# Patient Record
Sex: Female | Born: 1965 | Race: White | Hispanic: No | Marital: Married | Smoking: Current every day smoker
Health system: Southern US, Community
[De-identification: ages and names within clinical notes are randomized; demographics above are authoritative.]

## PROBLEM LIST (undated history)

## (undated) DIAGNOSIS — J449 Chronic obstructive pulmonary disease, unspecified: Secondary | ICD-10-CM

## (undated) DIAGNOSIS — E785 Hyperlipidemia, unspecified: Secondary | ICD-10-CM

## (undated) DIAGNOSIS — F32A Depression, unspecified: Secondary | ICD-10-CM

## (undated) DIAGNOSIS — F419 Anxiety disorder, unspecified: Secondary | ICD-10-CM

## (undated) DIAGNOSIS — M1990 Unspecified osteoarthritis, unspecified site: Secondary | ICD-10-CM

## (undated) DIAGNOSIS — G4730 Sleep apnea, unspecified: Secondary | ICD-10-CM

## (undated) DIAGNOSIS — T7840XA Allergy, unspecified, initial encounter: Secondary | ICD-10-CM

## (undated) HISTORY — DX: Sleep apnea, unspecified: G47.30

## (undated) HISTORY — DX: Hyperlipidemia, unspecified: E78.5

## (undated) HISTORY — PX: OTHER SURGICAL HISTORY: SHX169

## (undated) HISTORY — DX: Allergy, unspecified, initial encounter: T78.40XA

## (undated) HISTORY — DX: Anxiety disorder, unspecified: F41.9

## (undated) HISTORY — DX: Depression, unspecified: F32.A

## (undated) HISTORY — PX: HYSTERECTOMY ABDOMINAL WITH SALPINGECTOMY: SHX6725

---

## 2012-07-18 ENCOUNTER — Other Ambulatory Visit: Payer: Self-pay

## 2012-07-18 ENCOUNTER — Emergency Department (HOSPITAL_COMMUNITY)
Admission: EM | Admit: 2012-07-18 | Discharge: 2012-07-18 | Disposition: A | Discharge status: Home / Self Care | Payer: Self-pay | Attending: Emergency Medicine | Admitting: Emergency Medicine

## 2012-07-18 ENCOUNTER — Emergency Department (HOSPITAL_COMMUNITY): Payer: Self-pay

## 2012-07-18 ENCOUNTER — Encounter (HOSPITAL_COMMUNITY): Payer: Self-pay | Admitting: *Deleted

## 2012-07-18 DIAGNOSIS — R0789 Other chest pain: Secondary | ICD-10-CM | POA: Insufficient documentation

## 2012-07-18 DIAGNOSIS — F17200 Nicotine dependence, unspecified, uncomplicated: Secondary | ICD-10-CM | POA: Insufficient documentation

## 2012-07-18 DIAGNOSIS — R0602 Shortness of breath: Secondary | ICD-10-CM | POA: Insufficient documentation

## 2012-07-18 DIAGNOSIS — J449 Chronic obstructive pulmonary disease, unspecified: Secondary | ICD-10-CM | POA: Insufficient documentation

## 2012-07-18 DIAGNOSIS — R109 Unspecified abdominal pain: Secondary | ICD-10-CM | POA: Insufficient documentation

## 2012-07-18 DIAGNOSIS — J439 Emphysema, unspecified: Secondary | ICD-10-CM

## 2012-07-18 DIAGNOSIS — J4489 Other specified chronic obstructive pulmonary disease: Secondary | ICD-10-CM | POA: Insufficient documentation

## 2012-07-18 HISTORY — DX: Chronic obstructive pulmonary disease, unspecified: J44.9

## 2012-07-18 LAB — COMPREHENSIVE METABOLIC PANEL
ALT: 16 U/L (ref 0–35)
AST: 18 U/L (ref 0–37)
Albumin: 3.8 g/dL (ref 3.5–5.2)
Alkaline Phosphatase: 88 U/L (ref 39–117)
Calcium: 9.9 mg/dL (ref 8.4–10.5)
GFR calc Af Amer: >90 mL/min (ref >90)
Glucose, Bld: 85 mg/dL (ref 70–99)
Potassium: 4.3 mEq/L (ref 3.5–5.1)
Sodium: 140 mEq/L (ref 135–145)
Total Protein: 7.6 g/dL (ref 6.0–8.3)

## 2012-07-18 LAB — CBC WITH DIFFERENTIAL/PLATELET
Basophils Absolute: 0 10*3/uL (ref 0.0–0.1)
Basophils Relative: 0 % (ref 0–1)
Eosinophils Absolute: 0.1 10*3/uL (ref 0.0–0.7)
Eosinophils Relative: 1 % (ref 0–5)
Lymphs Abs: 2.8 10*3/uL (ref 0.7–4.0)
MCH: 32.1 pg (ref 26.0–34.0)
MCHC: 35.8 g/dL (ref 30.0–36.0)
MCV: 89.6 fL (ref 78.0–100.0)
Neutrophils Relative %: 60 % (ref 43–77)
Platelets: 237 10*3/uL (ref 150–400)
RBC: 4.61 MIL/uL (ref 3.87–5.11)
RDW: 12.9 % (ref 11.5–15.5)

## 2012-07-18 LAB — URINALYSIS, ROUTINE W REFLEX MICROSCOPIC
Glucose, UA: NEGATIVE mg/dL
Hgb urine dipstick: NEGATIVE
Leukocytes, UA: NEGATIVE
Protein, ur: NEGATIVE mg/dL
Specific Gravity, Urine: 1.009 (ref 1.005–1.030)

## 2012-07-18 LAB — PRO B NATRIURETIC PEPTIDE: Pro B Natriuretic peptide (BNP): 75.8 pg/mL (ref 0–125)

## 2012-07-18 LAB — POCT I-STAT TROPONIN I: Troponin i, poc: 0 ng/mL (ref 0.00–0.08)

## 2012-07-18 MED ORDER — ALBUTEROL SULFATE (5 MG/ML) 0.5% IN NEBU
5.0000 mg | INHALATION_SOLUTION | Freq: Once | RESPIRATORY_TRACT | Status: AC
  Administered 2012-07-18: 5 mg via RESPIRATORY_TRACT
  Filled 2012-07-18: qty 1

## 2012-07-18 MED ORDER — IOHEXOL 350 MG/ML SOLN
100.0000 mL | Freq: Once | INTRAVENOUS | Status: AC | PRN
  Administered 2012-07-18: 100 mL via INTRAVENOUS

## 2012-07-18 MED ORDER — IPRATROPIUM BROMIDE 0.02 % IN SOLN
0.5000 mg | Freq: Once | RESPIRATORY_TRACT | Status: AC
  Administered 2012-07-18: 0.5 mg via RESPIRATORY_TRACT
  Filled 2012-07-18: qty 2.5

## 2012-07-18 MED ORDER — METHYLPREDNISOLONE SODIUM SUCC 125 MG IJ SOLR
125.0000 mg | Freq: Once | INTRAMUSCULAR | Status: AC
  Administered 2012-07-18: 125 mg via INTRAVENOUS
  Filled 2012-07-18: qty 2

## 2012-07-18 MED ORDER — PREDNISONE 10 MG PO TABS: ORAL_TABLET | ORAL | Status: DC

## 2012-07-18 MED ORDER — HYDROCODONE-ACETAMINOPHEN 5-500 MG PO TABS: 1.0000 | ORAL_TABLET | Freq: Four times a day (QID) | ORAL | Status: DC | PRN

## 2012-07-18 MED ORDER — LORAZEPAM 1 MG PO TABS
1.0000 mg | ORAL_TABLET | Freq: Once | ORAL | Status: AC
  Administered 2012-07-18: 1 mg via ORAL
  Filled 2012-07-18: qty 1

## 2012-07-18 NOTE — ED Notes (Signed)
The pt is c/o sob and bi-lateral lower chest pain sudden onset Wednesday.  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  Rt thigh pain for 2-3 days then stopped and that is when her chest pain started.  lmp none hys

## 2012-07-18 NOTE — ED Provider Notes (Signed)
This chart was scribed for Glynn Octave, MD by Bennett Scrape, ED Scribe. This patient was seen in room A05C/A05C and the patient's care was started at 8:42 PM.  Holly Macdonald is a 46 y.o. female with a h/o COPD (diagnosed 10 years ago) who presents to the Emergency Department complaining of 2 days of gradual onset, gradually worsening, constant SOB that is worse with laying down with associated mid back pain that radiates under both anterior ribs. Pt states that the symptoms started after she woke up 2 days ago. She denies any prior admissions for COPD exacerbation. She reports a mild HA attributed to sinus infections but denies cough, fevers, and leg swelling as associated symptoms. She is a current everyday smoker but denies alcohol use.  No PCP, moved here 2 years ago  RESPIRATORY: Tachypnea, lungs are clear to ausculation, no wheezing or rhonchi noted MUSCULOSKELETAL: No lower extremity edema  8:47 PM- Discussed treatment plan which includes a CT angio with pt at bedside and pt agreed to plan. Advised pt to quit smoking and consueled her on her smoking cessation options.  I personally performed the services described in this documentation, which was scribed in my presence. The recorded information has been reviewed and is accurate.  Medical screening examination/treatment/procedure(s) were conducted as a shared visit with non-physician practitioner(s) and myself.  I personally evaluated the patient during the encounter   Glynn Octave, MD 07/18/12 [phone]

## 2012-07-18 NOTE — ED Provider Notes (Signed)
History     CSN: [id number]  Arrival date & time 07/18/12  1534   First MD Initiated Contact with Patient 07/18/12 1837      Chief Complaint  Patient presents with  . Shortness of Breath    (Consider location/radiation/quality/duration/timing/severity/associated sxs/prior treatment) HPI Comments: Holly Macdonald is a 46 y.o. female who presents with complaint of shortness of breath and bilateral lower rib pain. Pt states pain started 2 days ago when she woke up. States pain is over bilateral lower chest radiating into back. States pain worsened with deep breathing and exertion. States no prior hx of the same. Hx of COPD, not on any medications. States recent cold, however denies cough. Denies fever, chills. States no recent surgeries or travel. Admits to pain in right thigh last week, however that has resolved. No LE swelling.    Past Medical History  Diagnosis Date  . COPD (chronic obstructive pulmonary disease)     History reviewed. No pertinent past surgical history.  No family history on file.  History  Substance Use Topics  . Smoking status: Current Every Day Smoker  . Smokeless tobacco: Not on file  . Alcohol Use: No    OB History    Grav Para Term Preterm Abortions TAB SAB Ect Mult Living                  Review of Systems  Constitutional: Negative for chills and fatigue.  HENT: Negative for neck pain and neck stiffness.   Respiratory: Positive for chest tightness and shortness of breath. Negative for cough and choking.   Cardiovascular: Positive for chest pain. Negative for palpitations and leg swelling.  Gastrointestinal: Negative.   Genitourinary: Positive for flank pain.  Musculoskeletal: Negative.   Skin: Negative.   Neurological: Negative for dizziness, weakness and light-headedness.  Psychiatric/Behavioral: Negative.     Allergies  Review of patient's allergies indicates not on file.  Home Medications  No current outpatient prescriptions on  file.  BP 122/81  Pulse 78  Temp 97.8 F (36.6 C) (Oral)  Resp 15  SpO2 100%  Physical Exam  Nursing note and vitals reviewed. Constitutional: She is oriented to person, place, and time. She appears well-developed and well-nourished. No distress.  Eyes: Conjunctivae normal are normal.  Neck: Neck supple.  Cardiovascular: Normal rate, regular rhythm and normal heart sounds.   Pulmonary/Chest:       Tachypenc, breath sounds diminished bilaterally  Abdominal: Soft. Bowel sounds are normal. She exhibits no distension. There is no tenderness. There is no rebound.  Musculoskeletal: She exhibits no edema.       No calf tenderness bilaterally  Neurological: She is alert and oriented to person, place, and time.  Skin: Skin is warm and dry.  Psychiatric: She has a normal mood and affect. Her behavior is normal.    ED Course  Procedures (including critical care time)  Pt with SOB, bilateral lower chest pain for 2-3 days. Will get labs, CXR, monitor.    Date: 07/18/2012  Rate: 77  Rhythm: normal sinus rhythm and sinus arrhythmia  QRS Axis: normal  Intervals: normal  ST/T Wave abnormalities: normal  Conduction Disutrbances:nonspecific intraventricular conduction delay  Narrative Interpretation:   Old EKG Reviewed: none available  Results for orders placed during the hospital encounter of 07/18/12  CBC WITH DIFFERENTIAL      Component Value Range   WBC 8.8  4.0 - 10.5 K/uL   RBC 4.61  3.87 - 5.11 MIL/uL   Hemoglobin  14.8  12.0 - 15.0 g/dL   HCT 16.1  09.6 - 04.5 %   MCV 89.6  78.0 - 100.0 fL   MCH 32.1  26.0 - 34.0 pg   MCHC 35.8  30.0 - 36.0 g/dL   RDW 40.9  81.1 - 91.4 %   Platelets 237  150 - 400 K/uL   Neutrophils Relative 60  43 - 77 %   Neutro Abs 5.2  1.7 - 7.7 K/uL   Lymphocytes Relative 32  12 - 46 %   Lymphs Abs 2.8  0.7 - 4.0 K/uL   Monocytes Relative 7  3 - 12 %   Monocytes Absolute 0.6  0.1 - 1.0 K/uL   Eosinophils Relative 1  0 - 5 %   Eosinophils Absolute  0.1  0.0 - 0.7 K/uL   Basophils Relative 0  0 - 1 %   Basophils Absolute 0.0  0.0 - 0.1 K/uL  COMPREHENSIVE METABOLIC PANEL      Component Value Range   Sodium 140  135 - 145 mEq/L   Potassium 4.3  3.5 - 5.1 mEq/L   Chloride 103  96 - 112 mEq/L   CO2 24  19 - 32 mEq/L   Glucose, Bld 85  70 - 99 mg/dL   BUN 7  6 - 23 mg/dL   Creatinine, Ser 7.82  0.50 - 1.10 mg/dL   Calcium 9.9  8.4 - 95.6 mg/dL   Total Protein 7.6  6.0 - 8.3 g/dL   Albumin 3.8  3.5 - 5.2 g/dL   AST 18  0 - 37 U/L   ALT 16  0 - 35 U/L   Alkaline Phosphatase 88  39 - 117 U/L   Total Bilirubin 0.2 (*) 0.3 - 1.2 mg/dL   GFR calc non Af Amer >90  >90 mL/min   GFR calc Af Amer >90  >90 mL/min  TROPONIN I      Component Value Range   Troponin I <0.30  <0.30 ng/mL  D-DIMER, QUANTITATIVE      Component Value Range   D-Dimer, Quant <0.27  0.00 - 0.48 ug/mL-FEU  URINALYSIS, ROUTINE W REFLEX MICROSCOPIC      Component Value Range   Color, Urine YELLOW  YELLOW   APPearance CLOUDY (*) CLEAR   Specific Gravity, Urine 1.009  1.005 - 1.030   pH 6.5  5.0 - 8.0   Glucose, UA NEGATIVE  NEGATIVE mg/dL   Hgb urine dipstick NEGATIVE  NEGATIVE   Bilirubin Urine NEGATIVE  NEGATIVE   Ketones, ur NEGATIVE  NEGATIVE mg/dL   Protein, ur NEGATIVE  NEGATIVE mg/dL   Urobilinogen, UA 0.2  0.0 - 1.0 mg/dL   Nitrite NEGATIVE  NEGATIVE   Leukocytes, UA NEGATIVE  NEGATIVE  PRO B NATRIURETIC PEPTIDE      Component Value Range   Pro B Natriuretic peptide (BNP) 75.8  0 - 125 pg/mL   Dg Chest 2 View  07/18/2012  *RADIOLOGY REPORT*  Clinical Data: Shortness of breath.  CHEST - 2 VIEW  Comparison:  None.  Findings:  The heart size and mediastinal contours are within normal limits.  Both lungs are clear.  The visualized skeletal structures are unremarkable.  IMPRESSION: No active cardiopulmonary disease.   Original Report Authenticated By: Myles Rosenthal, M.D.    Ct Angio Chest W/cm &/or Wo Cm  07/18/2012  *RADIOLOGY REPORT*  Clinical Data:  Shortness of breath  CT ANGIOGRAPHY CHEST  Technique:  Multidetector CT imaging of the chest using  the standard protocol during bolus administration of intravenous contrast. Multiplanar reconstructed images including MIPs were obtained and reviewed to evaluate the vascular anatomy.  Contrast: OMNIPAQUE IOHEXOL 350 MG/ML SOLN  Comparison: 07/18/2012 radiograph  Findings: Several images are degraded by respiratory motion. Allowing for this, no pulmonary arterial branch filling defect is identified.  Normal caliber aorta with scattered atherosclerosis. Normal heart size.  No pleural or pericardial effusion.  No intrathoracic lymphadenopathy.  Limited images through the upper abdomen show no acute finding.  Central airways are patent. There are centrolobular emphysematous changes. No confluent airspace opacity. 3 mm nodule right upper lobe on series 5 image 37.  There are a couple additional subpleural nodules of a similar size.  No pneumothorax.  No acute osseous finding.  IMPRESSION: Several images degraded by respiratory motion.  Allowing for this, no pulmonary embolism identified.  Centrolobular emphysematous changes.  There are a couple subpleural nodules, measuring up to 3 mm, nonspecific.  12 month chest CT follow-up recommended.   Original Report Authenticated By: Jearld Lesch, M.D.       1. Shortness of breath   2. Emphysema       MDM  Pt with SOB, tachypnea, pain over bilateral lower lungs. Denies fever, chills, malaise, denies productive cough. She is a smoker. Labs, CT angio, CXR negative. Lungs are clear. Pt improved with 2 nebs. Two sets of troponins negative. Pt is speaking with full sentences. Abdomen non tender. Discussed with Dr. Manus Gunning who saw pt as well. Will start on steroids, albuterol, pain meds, follow up as needed. Instructed to return if worsening.         Lottie Mussel, [place] 07/19/12 [phone]

## 2012-07-18 NOTE — ED Notes (Signed)
Pt dc to home.   Pt ambulatory to exit without difficulty.  Pt verbalizes understanding to dc instructions.  Pt will f/u with provider.

## 2012-07-19 NOTE — ED Provider Notes (Signed)
Medical screening examination/treatment/procedure(s) were conducted as a shared visit with non-physician practitioner(s) and myself.  I personally evaluated the patient during the encounter See my additional note  Glynn Octave, MD 07/19/12 1215

## 2014-05-03 ENCOUNTER — Other Ambulatory Visit (HOSPITAL_COMMUNITY): Payer: Self-pay | Admitting: Physician Assistant

## 2014-05-03 DIAGNOSIS — Z1231 Encounter for screening mammogram for malignant neoplasm of breast: Secondary | ICD-10-CM

## 2014-05-05 ENCOUNTER — Ambulatory Visit (HOSPITAL_COMMUNITY)
Admission: RE | Admit: 2014-05-05 | Discharge: 2014-05-05 | Disposition: A | Discharge status: Home / Self Care | Payer: Self-pay | Source: Ambulatory Visit | Attending: Physician Assistant | Admitting: Physician Assistant

## 2014-05-05 DIAGNOSIS — Z1231 Encounter for screening mammogram for malignant neoplasm of breast: Secondary | ICD-10-CM

## 2014-07-21 ENCOUNTER — Emergency Department (HOSPITAL_COMMUNITY)
Admission: EM | Admit: 2014-07-21 | Discharge: 2014-07-21 | Disposition: A | Discharge status: Home / Self Care | Payer: Self-pay | Attending: Emergency Medicine | Admitting: Emergency Medicine

## 2014-07-21 ENCOUNTER — Encounter (HOSPITAL_COMMUNITY): Payer: Self-pay | Admitting: Cardiology

## 2014-07-21 DIAGNOSIS — Z7952 Long term (current) use of systemic steroids: Secondary | ICD-10-CM | POA: Insufficient documentation

## 2014-07-21 DIAGNOSIS — J449 Chronic obstructive pulmonary disease, unspecified: Secondary | ICD-10-CM | POA: Insufficient documentation

## 2014-07-21 DIAGNOSIS — Z880 Allergy status to penicillin: Secondary | ICD-10-CM | POA: Insufficient documentation

## 2014-07-21 DIAGNOSIS — M1990 Unspecified osteoarthritis, unspecified site: Secondary | ICD-10-CM | POA: Insufficient documentation

## 2014-07-21 DIAGNOSIS — M62830 Muscle spasm of back: Secondary | ICD-10-CM | POA: Insufficient documentation

## 2014-07-21 DIAGNOSIS — Z720 Tobacco use: Secondary | ICD-10-CM | POA: Insufficient documentation

## 2014-07-21 HISTORY — DX: Unspecified osteoarthritis, unspecified site: M19.90

## 2014-07-21 MED ORDER — HYDROCODONE-ACETAMINOPHEN 5-325 MG PO TABS: 2.0000 | ORAL_TABLET | ORAL | Status: DC | PRN

## 2014-07-21 MED ORDER — METHOCARBAMOL 500 MG PO TABS: 500.0000 mg | ORAL_TABLET | Freq: Two times a day (BID) | ORAL | Status: DC | PRN

## 2014-07-21 MED ORDER — NAPROXEN 500 MG PO TABS: 500.0000 mg | ORAL_TABLET | Freq: Two times a day (BID) | ORAL | Status: DC

## 2014-07-21 MED ORDER — KETOROLAC TROMETHAMINE 60 MG/2ML IM SOLN
60.0000 mg | Freq: Once | INTRAMUSCULAR | Status: AC
  Administered 2014-07-21: 60 mg via INTRAMUSCULAR
  Filled 2014-07-21: qty 2

## 2014-07-21 MED ORDER — METHOCARBAMOL 500 MG PO TABS
500.0000 mg | ORAL_TABLET | Freq: Once | ORAL | Status: AC
  Administered 2014-07-21: 500 mg via ORAL
  Filled 2014-07-21: qty 1

## 2014-07-21 NOTE — ED Notes (Signed)
Lower back spasms Monday.  Pain is worse today.  C/o pain and numbness to right leg and foot.  States she has had a cyst on sacral area 7-8 years ago and she feels like the cyst has been coming back the past 3 months.

## 2014-07-21 NOTE — Discharge Instructions (Signed)
Back Pain: ° ° °Your back pain should be treated with medicines such as ibuprofen or aleve and this back pain should get better over the next 2 weeks.  However if you develop severe or worsening pain, low back pain with fever, numbness, weakness or inability to walk or urinate, you should return to the ER immediately.  Please follow up with your doctor this week for a recheck if still having symptoms. °Low back pain is discomfort in the lower back that may be due to injuries to muscles and ligaments around the spine.  Occasionally, it may be caused by a a problem to a part of the spine called a disc.  The pain may last several days or a week;  However, most patients get completely well in 4 weeks. ° °Self - care:  The application of heat can help soothe the pain.  Maintaining your daily activities, including walking, is encourged, as it will help you get better faster than just staying in bed. ° °Medications are also useful to help with pain control.  A commonly prescribed medications includes acetaminophen.  This medication is generally safe, though you should not take more than 8 of the extra strength (500mg) pills a day. ° °Non steroidal anti inflammatory medications including Ibuprofen and naproxen;  These medications help both pain and swelling and are very useful in treating back pain.  They should be taken with food, as they can cause stomach upset, and more seriously, stomach bleeding.   ° °Muscle relaxants:  These medications can help with muscle tightness that is a cause of lower back pain.  Most of these medications can cause drowsiness, and it is not safe to drive or use dangerous machinery while taking them. ° °You will need to follow up with  Your primary healthcare provider in 1-2 weeks for reassessment. ° °Be aware that if you develop new symptoms, such as a fever, leg weakness, difficulty with or loss of control of your urine or bowels, abdominal pain, or more severe pain, you will need to seek  medical attention and  / or return to the Emergency department. ° °If you do not have a doctor see the list below. ° °RESOURCE GUIDE ° °Chronic Pain Problems: °Contact [facility]  [phone] °Patients need to be referred by their primary care doctor. ° °Insufficient Money for Medicine: °Contact [employer] Way:  call "211" or [facility] 271-5999. ° °No Primary Care Doctor: °- Call [facility]  832-8000 - can help you locate a primary care doctor that  accepts your insurance, provides certain services, etc. °- Physician Referral Service- 1-800-533-3463 ° °Agencies that provide inexpensive medical care: °- [facility]  [phone] °- [facility]  [phone] °- [facility]  [phone] °- [facility]  832-4777 °- [facility]  [phone] °- [facility]  [phone] ° °Medicaid-accepting [place] Providers: °- [facility]- [address] ° [phone], Mon-Fri 9am-7pm, Sat 9am-1pm °- [facility]- [address], [address] ° [phone] °- [facility]- [address], [address] ° [phone] °- [facility]- [address] [place] [address] ° [phone] °- [facility]- [address], [address], [phone] ° Only accepts [place] Access Medicaid patients after they have their name  applied to their card ° °Self Pay (no insurance) in [place]: °- Sickle Cell Patients: Dr Eric Dean, [facility] °   [address], [phone] °- [facility]- [address] ° [phone] °      -      [facility]- [address] °      -     [facility]- see information above (Speak to Pam H if you do not have insurance) °      -  [facility]- [address], [phone] °      -  [facility] [place]- [address],  [phone] °      -  [facility]- [address] [place] [address],  [phone] °      -  Dr Osei-Bonsu-  [address], [place], [phone] °      -  [facility]- [address], [phone] °      -  [facility]- [address] [place] [address], [phone], also [address], [phone] °      -    [facility]- [address], [phone], 1st & 3rd Saturday   every month, 10am-1pm ° °1) Find a Doctor and Pay Out of Pocket °Although you won't have to find out who is covered by your insurance plan, it is a good idea to ask around and get recommendations. You will then need to call the office and see if the doctor you have chosen will accept you as a new patient and what types of options they offer for patients who are self-pay. Some doctors offer discounts or will set up payment plans for their patients who do not have insurance, but you will need to ask so you aren't surprised when you get to your appointment. ° °2) Contact Your Local Health Department °Not all health departments have doctors that can see patients for sick visits, but many do, so it is worth a call to see if yours does. If you don't know where your local health department is, you can check in your phone book. The CDC also has a tool to help you locate your state's health department, and many state websites also have listings of all of their local health departments. ° °3) Find a Walk-in Clinic °If your illness is not likely to be very severe or complicated, you may want to try a walk in clinic. These are popping up all over the country in pharmacies, drugstores, and shopping centers. They're usually staffed by nurse practitioners or physician assistants that have been trained to treat common illnesses and complaints. They're usually fairly quick and inexpensive. However, if you have serious medical issues or chronic medical problems, these are probably not your best option ° °STD Testing °- [place] [facility] [place], [facility], [address], [place], phone [phone] or 1-877-539-9860.  Monday - Friday, call for an appointment. °- [place] [facility] [place], [facility], [address], [place], phone [phone] or 1-877-539-9860.  Monday - Friday, call for an appointment. ° °Abuse/Neglect: °- [place] Child Abuse Hotline (336) 641-3795 °- [place] Child Abuse Hotline 800-378-5315 (After Hours) ° °Emergency Shelter:  [facility] (336) 271-5985 ° °Maternity Homes: °- [facility] (336) 275-9566 °- [facility] (704) 372-4663 ° °MRSA Hotline #:   832-7006 ° °[place] Resources ° °[facility] [place]  [employer] [place] [facility] °[address]                 [address]         [address]  °[place]                                               [place]                              [place] °Phone:  349-3220                                  Phone:  [phone]                   Phone:  342-8140 ° °[facility], 342-8316 °- [place] [facility] - [facility]- 1-888-581-9988 °      -     [facility] in [place], [address],                                  336-349-4454, Insurance ° °[place] Child Abuse Hotline °(336) 342-1394 or (336) 342-3537 (After Hours) ° ° °Behavioral Health Services ° °Substance Abuse Resources: °- [facility]  336-882-2125 °- [facility] 336-784-9470 °- [facility] 336-285-9073 °- [facility] 336-845-3988 °- Residential & Outpatient Substance Abuse Program  800-659-3381 ° °Psychological Services: °- [facility]  832-9600 °- [facility]  378-7881 °- [facility], [address], [place], ACCESS LINE: 1-800-853-5163 or 336-641-4981, Http://www.[facility]/services/adult.htm ° °Dental Assistance ° °If unable to pay or  uninsured, contact:  [facility] or [facility] to become qualified for the adult dental clinic. ° °Patients with Medicaid: [facility] °[address], [phone] °[address], [phone] ° °If unable to pay, or uninsured, contact [facility] (271-5999) or [place] [facility] (641-3152 in [place], 842-7733 in [place]) to become qualified for the adult dental clinic ° °Other Low-Cost Community Dental Services: °- [facility]- [address], [place], [place], [address], 723-1848, Ext. [address], 2nd and 4th Thursday of the month at 6:30am.  10 clients each day by appointment, can sometimes see walk-in patients if someone does not show for an appointment. °- [facility]- [address], [place], [place], [address], [phone] °- [facility]- [address], [place], [place], [address], [phone] °- [facility]- 342-8273 °- [facility]- [phone] °- [facility]- [phone] ° ° ° ° ° ° °

## 2014-07-21 NOTE — ED Provider Notes (Signed)
CSN: [id number]     Arrival date & time 07/21/14  1048 History   This chart was scribed for [name], MD by Rayfield Citizen, ED Scribe. This patient was seen in room APA10/APA10 and the patient's care was started at 10:55 AM.    Chief Complaint  Patient presents with  . Back Pain   The history is provided by the patient. No language interpreter was used.     HPI Comments: Holly Macdonald is a 48 y.o. female who presents to the Emergency Department complaining of 3 days of worsening lower back pain and spasms, rated 8/10 at present. She also reports radiating pain to her groin, right leg and right foot. Her pain is worse with movement (ambulation, sitting up). She has difficulty ambulating due to pain. No red flags for pathological back pain. She denies numbness, weakness, tingling in her arms; she denies difficulty urinating, personal history of cancer, IV drugs or IV meds, recent tattoos, abdominal pain, chest pain, abnormal headaches.   Patient reports that she had a cyst on her sacral area 7-8 years ago and for the past three months, she feels as though the cyst may be returning.   She denies other significant medical history.   Past Medical History  Diagnosis Date  . COPD (chronic obstructive pulmonary disease)   . Arthritis    History reviewed. No pertinent past surgical history. History reviewed. No pertinent family history. History  Substance Use Topics  . Smoking status: Current Every Day Smoker  . Smokeless tobacco: Not on file  . Alcohol Use: No   OB History    No data available     Review of Systems  Cardiovascular: Negative for chest pain.  Genitourinary: Negative for difficulty urinating.  Musculoskeletal: Positive for back pain.  Neurological: Negative for weakness and headaches. Numbness: decreased sensation in right upper thigh.  All other systems reviewed and are negative.   Allergies  Morphine and related; Codeine; and Penicillins  Home Medications    Prior to Admission medications   Medication Sig Start Date End Date Taking? Authorizing Provider  acetaminophen (TYLENOL) 500 MG tablet Take 1,500 mg by mouth every 6 (six) hours as needed. For pain    Historical Provider, MD  HYDROcodone-acetaminophen (VICODIN) 5-500 MG per tablet Take 1 tablet by mouth every 6 (six) hours as needed for pain. 07/18/12   Tatyana A Kirichenko, PA-C  predniSONE (DELTASONE) 10 MG tablet Take 5 tab day 1, take 4 tab day 2, take 3 tab day 3, take 2 tab day 4, and take 1 tab day 5 07/18/12   Tatyana A Kirichenko, PA-C   Temp(Src) 97.9 F (36.6 C) (Oral)  Ht 5\' 2"  (1.575 m)  Wt 116 lb (52.617 kg)  BMI 21.21 kg/m2 Physical Exam  Constitutional: She is oriented to person, place, and time. She appears well-developed and well-nourished.  HENT:  Head: Normocephalic and atraumatic.  Neck: No tracheal deviation present.  Cardiovascular: Normal rate.   Pulmonary/Chest: Effort normal.  Musculoskeletal:  Right lower back TTP; no midline tenderness of CTLS spine  No joint swelling of the right lower extremity; no pain with passive rotation of the hip  Pain with flexion of the hip (low back) Able to SLR on the right, causes pain Regardless of position, change causes pain which goes away after a minute or two  Neurological: She is alert and oriented to person, place, and time.  Mild decreased sensation to the right upper thigh   Skin: Skin  is warm and dry.  Psychiatric: She has a normal mood and affect. Her behavior is normal.  Nursing note and vitals reviewed.   ED Course  Procedures   DIAGNOSTIC STUDIES:   COORDINATION OF CARE: 11:22 AM Discussed treatment plan with pt at bedside and pt agreed to plan.   Labs Review Labs Reviewed - No data to display  Imaging Review No results found.    MDM   Final diagnoses:  None   Pt has no red flags for pathological back pain - she ambulated with minimal difficulty but had obvious antalgic gatin -  improvement seen with meds - pt amenable to d/c.  Meds given in ED:  Medications  ketorolac (TORADOL) injection 60 mg (60 mg Intramuscular Given 07/21/14 1135)  methocarbamol (ROBAXIN) tablet 500 mg (500 mg Oral Given 07/21/14 1134)    Discharge Medication List as of 07/21/2014 11:24 AM    START taking these medications   Details  HYDROcodone-acetaminophen (NORCO/VICODIN) 5-325 MG per tablet Take 2 tablets by mouth every 4 (four) hours as needed., Starting 07/21/2014, Until Discontinued, Print    methocarbamol (ROBAXIN) 500 MG tablet Take 1 tablet (500 mg total) by mouth 2 (two) times daily as needed for muscle spasms., Starting 07/21/2014, Until Discontinued, Print    naproxen (NAPROSYN) 500 MG tablet Take 1 tablet (500 mg total) by mouth 2 (two) times daily with a meal., Starting 07/21/2014, Until Discontinued, Print         I personally performed the services described in this documentation, which was scribed in my presence. The recorded information has been reviewed and is accurate.        [name], MD 07/22/14 [phone]

## 2014-10-04 IMAGING — CT CT ANGIO CHEST
2 of 6 series · 19 of 46 positions shown · IV contrast (omnipaque)
Comparison: 07/18/2012 radiograph

CLINICAL DATA: Shortness of breath

CT ANGIOGRAPHY CHEST
TECHNIQUE: Multidetector CT imaging of the chest using the
standard protocol during bolus administration of intravenous
contrast. Multiplanar reconstructed images including MIPs were
obtained and reviewed to evaluate the vascular anatomy.
Contrast: 100mL OMNIPAQUE IOHEXOL 350 MG/ML SOLN

[Series 6: pulm embolism 1.0 b25f thin · axial · 0.57mm/px · z∈[-332,-66]mm · 16 of 293 slices shown]
[im 13/293  lung]
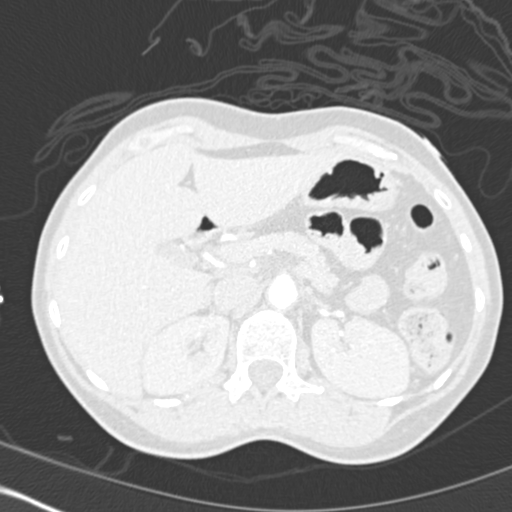
[im 39/293  soft-tissue]
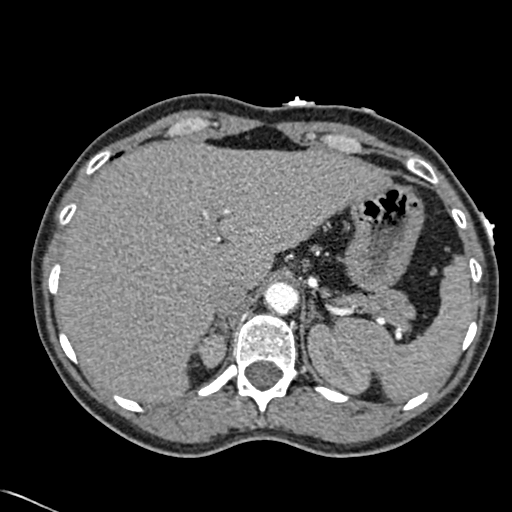
[im 51/293  lung]
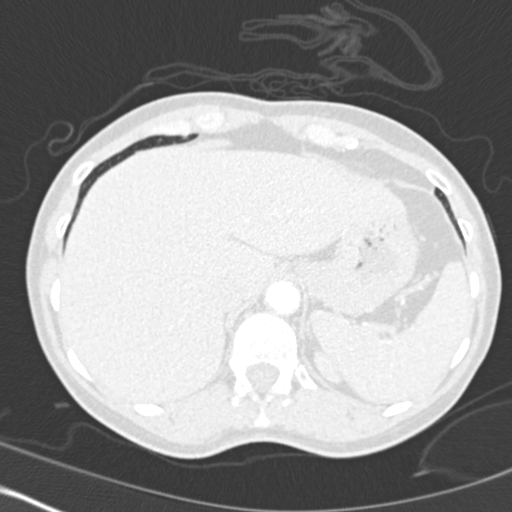
[im 64/293  soft-tissue]
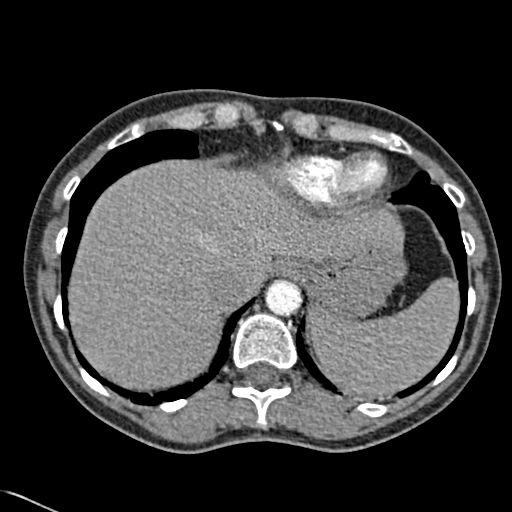
[im 89/293  lung]
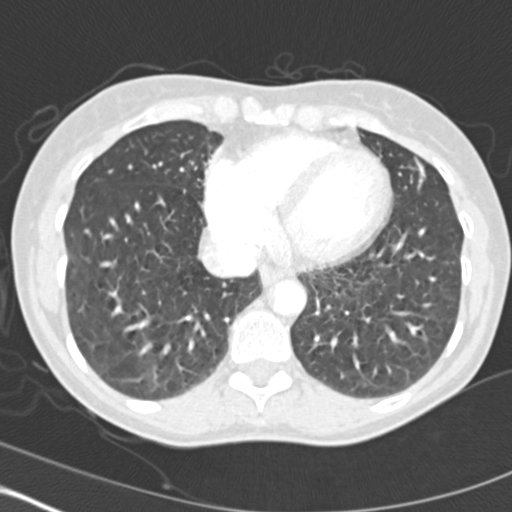
[im 102/293  soft-tissue]
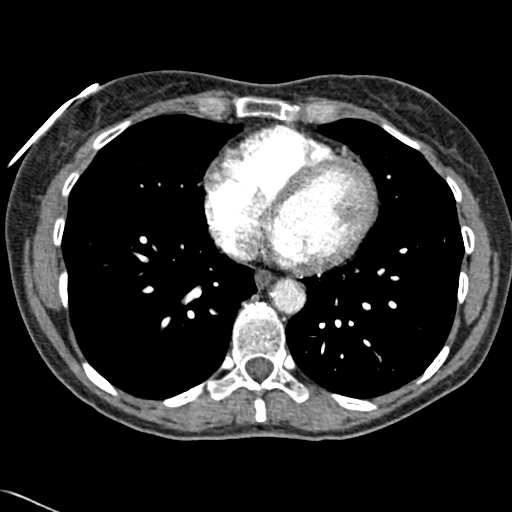
[im 115/293  lung]
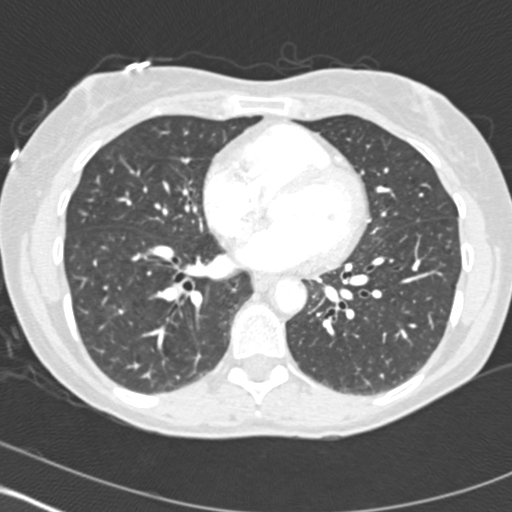
[im 140/293  soft-tissue]
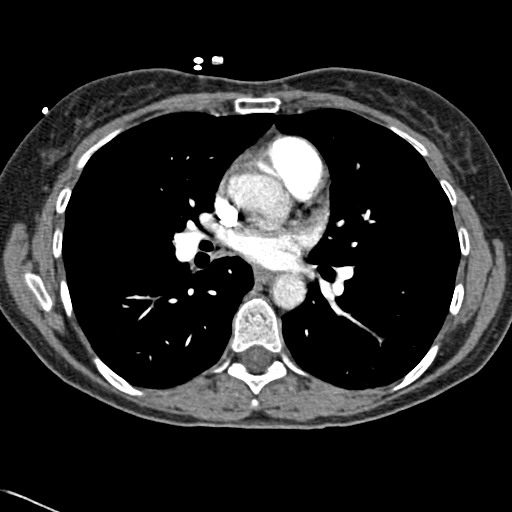
[im 153/293  lung]
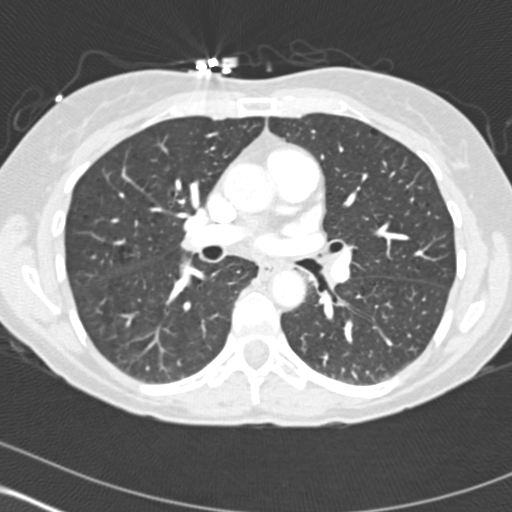
[im 178/293  soft-tissue]
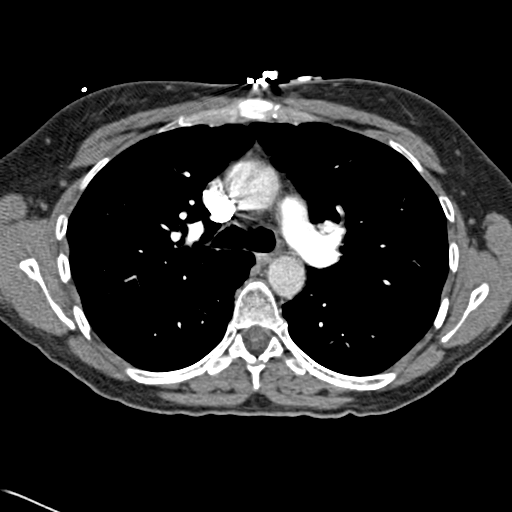
[im 191/293  lung]
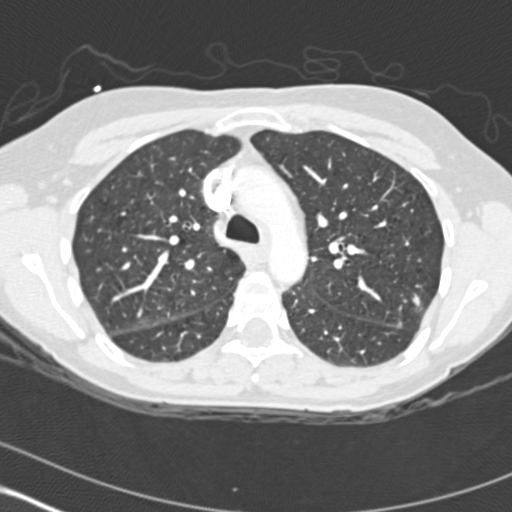
[im 204/293  soft-tissue]
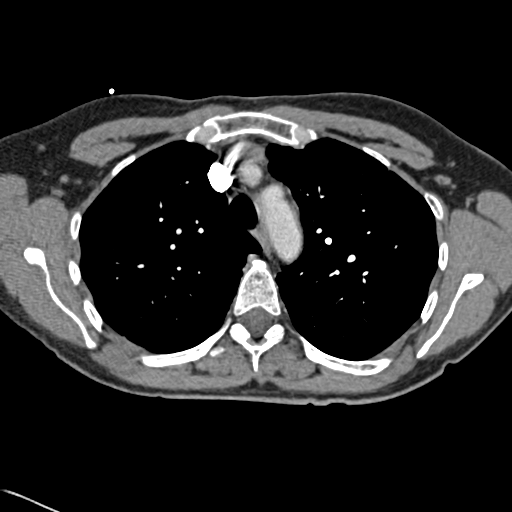
[im 229/293  lung]
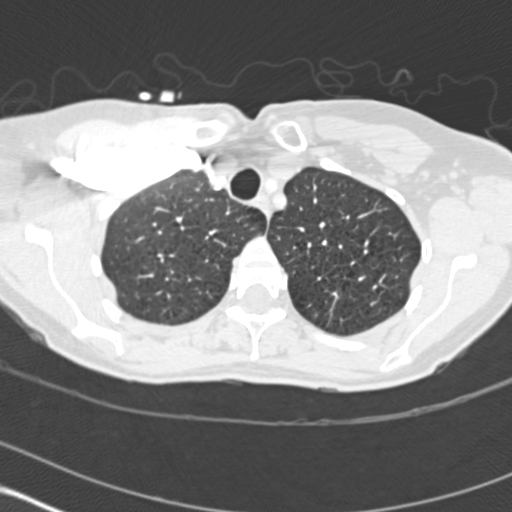
[im 242/293  soft-tissue]
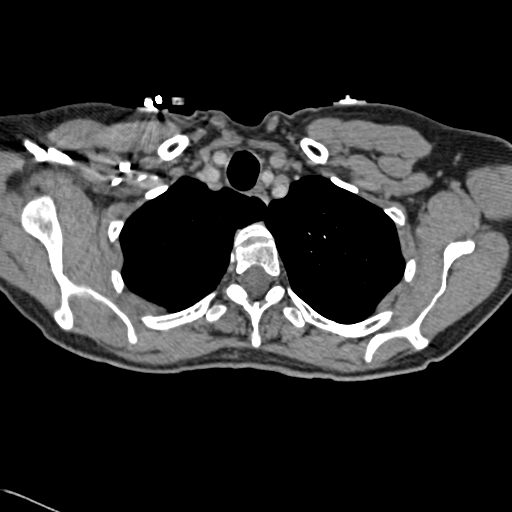
[im 254/293  lung]
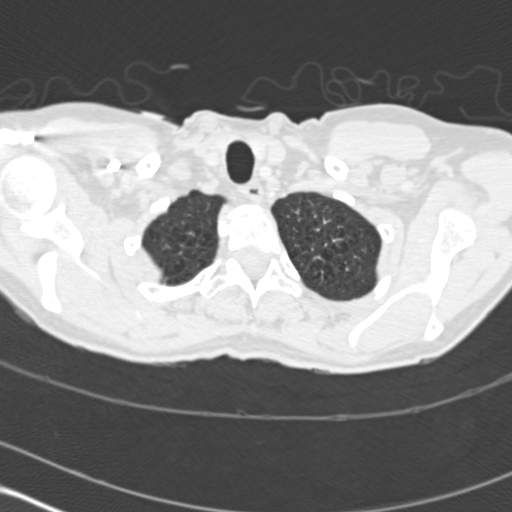
[im 280/293  soft-tissue]
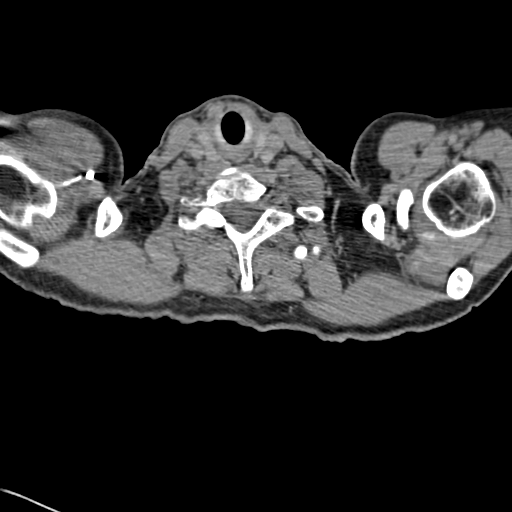

[Series 602: cor · coronal · 0.57mm/px · 3 of 92 slices shown]
[im 23/92  soft-tissue]
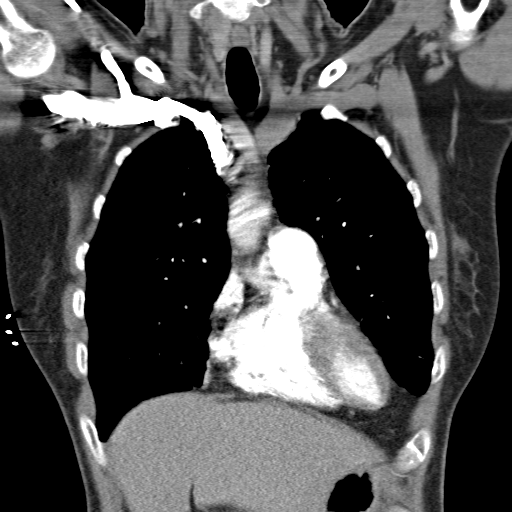
[im 46/92  soft-tissue]
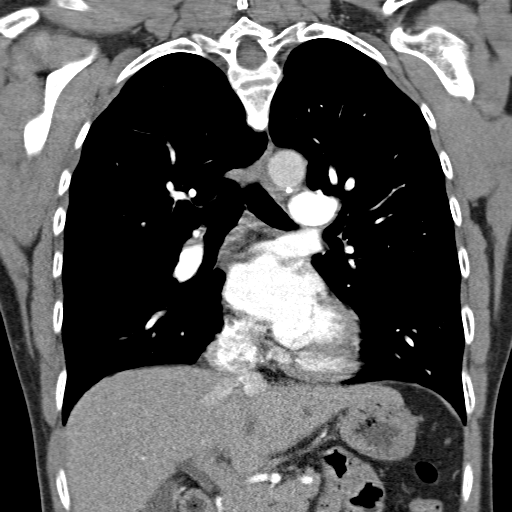
[im 69/92  soft-tissue]
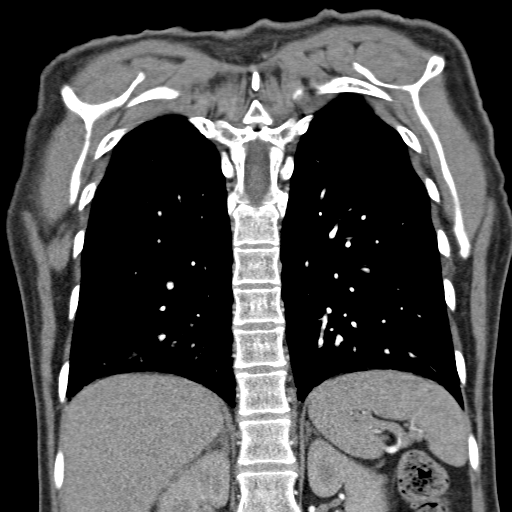

[19 of 46 positions shown; findings below may reference images not displayed]

FINDINGS: Several images are degraded by respiratory motion.
Allowing for this, no pulmonary arterial branch filling defect is
identified.  Normal caliber aorta with scattered atherosclerosis.
Normal heart size.  No pleural or pericardial effusion.  No
intrathoracic lymphadenopathy.

Limited images through the upper abdomen show no acute finding.

Central airways are patent. There are centrolobular emphysematous
changes. No confluent airspace opacity. 3 mm nodule right upper
lobe on series 5 image 37.  There are a couple additional
subpleural nodules of a similar size.  No pneumothorax.

No acute osseous finding.
IMPRESSION: Several images degraded by respiratory motion.  Allowing for this,
no pulmonary embolism identified.

Centrolobular emphysematous changes.

There are a couple subpleural nodules, measuring up to 3 mm,
nonspecific.  12 month chest CT follow-up recommended.

## 2015-09-21 ENCOUNTER — Ambulatory Visit (INDEPENDENT_AMBULATORY_CARE_PROVIDER_SITE_OTHER): Payer: BLUE CROSS/BLUE SHIELD | Admitting: Emergency Medicine

## 2015-09-21 VITALS — BP 130/78 | HR 75 | Temp 98.2°F | Resp 20 | Ht 63.0 in | Wt 123.4 lb

## 2015-09-21 DIAGNOSIS — J0140 Acute pansinusitis, unspecified: Secondary | ICD-10-CM

## 2015-09-21 DIAGNOSIS — F17200 Nicotine dependence, unspecified, uncomplicated: Secondary | ICD-10-CM

## 2015-09-21 MED ORDER — LEVOFLOXACIN 500 MG PO TABS: 500.0000 mg | ORAL_TABLET | Freq: Every day | ORAL | Status: AC

## 2015-09-21 MED ORDER — PSEUDOEPHEDRINE-GUAIFENESIN ER 60-600 MG PO TB12: 1.0000 | ORAL_TABLET | Freq: Two times a day (BID) | ORAL | Status: AC

## 2015-09-21 NOTE — Progress Notes (Signed)
Subjective:  Patient ID: Holly Macdonald, female    DOB: 04-Jun-1966  Age: 50 y.o. MRN: [id number]  CC: Sinus Problem and Sore Throat   HPI [name] presents    patient has a nasal congestion postnasal drainage purulent nasal discharge. He has pressure in around the cheeks and forehead. 's drainage from  Her eye a she has no cough fever chills wheezing or shortness of breath he has no nausea or vomiting. Stool change or rash.  She has strong history of smoking but is not interested in quitting.Marland Kitchen  History Shaniece has a past medical history of COPD (chronic obstructive pulmonary disease) ([place]) and Arthritis.   She has no past surgical history on file.   Her  family history is not on file.  She   reports that she has been smoking.  She does not have any smokeless tobacco history on file. She reports that she does not drink alcohol. Her drug history is not on file.  Outpatient Prescriptions Prior to Visit  Medication Sig Dispense Refill  . acetaminophen (TYLENOL) 650 MG CR tablet Take 650 mg by mouth 2 (two) times daily as needed for pain.    . calcium-vitamin D (OSCAL WITH D) 500-200 MG-UNIT per tablet Take 1 tablet by mouth daily with breakfast.    . HYDROcodone-acetaminophen (NORCO/VICODIN) 5-325 MG per tablet Take 2 tablets by mouth every 4 (four) hours as needed. (Patient not taking: Reported on 09/21/2015) 20 tablet 0  . lovastatin (MEVACOR) 10 MG tablet Take 10 mg by mouth at bedtime. Reported on 09/21/2015    . methocarbamol (ROBAXIN) 500 MG tablet Take 1 tablet (500 mg total) by mouth 2 (two) times daily as needed for muscle spasms. (Patient not taking: Reported on 09/21/2015) 20 tablet 0  . naproxen (NAPROSYN) 500 MG tablet Take 1 tablet (500 mg total) by mouth 2 (two) times daily with a meal. (Patient not taking: Reported on 09/21/2015) 30 tablet 0  . predniSONE (DELTASONE) 10 MG tablet Take 5 tab day 1, take 4 tab day 2, take 3 tab day 3, take 2 tab day 4, and take 1 tab day 5  (Patient not taking: Reported on 07/21/2014) 15 tablet 0   No facility-administered medications prior to visit.    Social History   Social History  . Marital Status: Single    Spouse Name: N/A  . Number of Children: N/A  . Years of Education: N/A   Social History Main Topics  . Smoking status: Current Every Day Smoker  . Smokeless tobacco: None  . Alcohol Use: No  . Drug Use: None  . Sexual Activity: Not Asked   Other Topics Concern  . None   Social History Narrative     Review of Systems  Constitutional: Negative for fever, chills and appetite change.  HENT: Positive for congestion, postnasal drip, rhinorrhea and sinus pressure. Negative for ear pain and sore throat.   Eyes: Positive for discharge. Negative for pain and redness.  Respiratory: Negative for cough, shortness of breath and wheezing.   Cardiovascular: Negative for leg swelling.  Gastrointestinal: Negative for nausea, vomiting, abdominal pain, diarrhea, constipation and blood in stool.  Endocrine: Negative for polyuria.  Genitourinary: Negative for dysuria, urgency, frequency and flank pain.  Musculoskeletal: Negative for gait problem.  Skin: Negative for rash.  Neurological: Negative for weakness and headaches.  Psychiatric/Behavioral: Negative for confusion and decreased concentration. The patient is not nervous/anxious.     Objective:  BP 130/78 mmHg  Pulse  75  Temp(Src) 98.2 F (36.8 C) (Oral)  Resp 20  Ht 5\' 3"  (1.6 m)  Wt 123 lb 6.4 oz (55.974 kg)  BMI 21.86 kg/m2  SpO2 98%  Physical Exam  Constitutional: She is oriented to person, place, and time. She appears well-developed and well-nourished. No distress.  HENT:  Head: Normocephalic and atraumatic.  Right Ear: External ear normal.  Left Ear: External ear normal.  Nose: Nose normal.  Eyes: Conjunctivae and EOM are normal. Pupils are equal, round, and reactive to light. No scleral icterus.  Neck: Normal range of motion. Neck supple. No  tracheal deviation present.  Cardiovascular: Normal rate, regular rhythm and normal heart sounds.   Pulmonary/Chest: Effort normal. No respiratory distress. She has no wheezes. She has no rales.  Abdominal: She exhibits no mass. There is no tenderness. There is no rebound and no guarding.  Musculoskeletal: She exhibits no edema.  Lymphadenopathy:    She has no cervical adenopathy.  Neurological: She is alert and oriented to person, place, and time. Coordination normal.  Skin: Skin is warm and dry. No rash noted.  Psychiatric: She has a normal mood and affect. Her behavior is normal.      Assessment & Plan:   Chidimma was seen today for sinus problem and sore throat.  Diagnoses and all orders for this visit:  Acute pansinusitis, recurrence not specified  Tobacco use disorder  Other orders -     pseudoephedrine-guaifenesin (MUCINEX D) 60-600 MG 12 hr tablet; Take 1 tablet by mouth every 12 (twelve) hours. -     levofloxacin (LEVAQUIN) 500 MG tablet; Take 1 tablet (500 mg total) by mouth daily.   I am having [name] start on pseudoephedrine-guaifenesin and levofloxacin. I am also having her maintain her predniSONE, lovastatin, acetaminophen, calcium-vitamin D, HYDROcodone-acetaminophen, methocarbamol, and naproxen.  Meds ordered this encounter  Medications  . pseudoephedrine-guaifenesin (MUCINEX D) 60-600 MG 12 hr tablet    Sig: Take 1 tablet by mouth every 12 (twelve) hours.    Dispense:  18 tablet    Refill:  0  . levofloxacin (LEVAQUIN) 500 MG tablet    Sig: Take 1 tablet (500 mg total) by mouth daily.    Dispense:  10 tablet    Refill:  0    Appropriate red flag conditions were discussed with the patient as well as actions that should be taken.  Patient expressed his understanding.  Follow-up: Return if symptoms worsen or fail to improve.  Roselee Culver, MD

## 2015-09-21 NOTE — Patient Instructions (Signed)
Smoking Cessation, Tips for Success If you are ready to quit smoking, congratulations! You have chosen to help yourself be healthier. Cigarettes bring nicotine, tar, carbon monoxide, and other irritants into your body. Your lungs, heart, and blood vessels will be able to work better without these poisons. There are many different ways to quit smoking. Nicotine gum, nicotine patches, a nicotine inhaler, or nicotine nasal spray can help with physical craving. Hypnosis, support groups, and medicines help break the habit of smoking. WHAT THINGS CAN I DO TO MAKE QUITTING EASIER?  Here are some tips to help you quit for good:  Pick a date when you will quit smoking completely. Tell all of your friends and family about your plan to quit on that date.  Do not try to slowly cut down on the number of cigarettes you are smoking. Pick a quit date and quit smoking completely starting on that day.  Throw away all cigarettes.   Clean and remove all ashtrays from your home, work, and car.  On a card, write down your reasons for quitting. Carry the card with you and read it when you get the urge to smoke.  Cleanse your body of nicotine. Drink enough water and fluids to keep your urine clear or pale yellow. Do this after quitting to flush the nicotine from your body.  Learn to predict your moods. Do not let a bad situation be your excuse to have a cigarette. Some situations in your life might tempt you into wanting a cigarette.  Never have "just one" cigarette. It leads to wanting another and another. Remind yourself of your decision to quit.  Change habits associated with smoking. If you smoked while driving or when feeling stressed, try other activities to replace smoking. Stand up when drinking your coffee. Brush your teeth after eating. Sit in a different chair when you read the paper. Avoid alcohol while trying to quit, and try to drink fewer caffeinated beverages. Alcohol and caffeine may urge you to  smoke.  Avoid foods and drinks that can trigger a desire to smoke, such as sugary or spicy foods and alcohol.  Ask people who smoke not to smoke around you.  Have something planned to do right after eating or having a cup of coffee. For example, plan to take a walk or exercise.  Try a relaxation exercise to calm you down and decrease your stress. Remember, you may be tense and nervous for the first 2 weeks after you quit, but this will pass.  Find new activities to keep your hands busy. Play with a pen, coin, or rubber band. Doodle or draw things on paper.  Brush your teeth right after eating. This will help cut down on the craving for the taste of tobacco after meals. You can also try mouthwash.   Use oral substitutes in place of cigarettes. Try using lemon drops, carrots, cinnamon sticks, or chewing gum. Keep them handy so they are available when you have the urge to smoke.  When you have the urge to smoke, try deep breathing.  Designate your home as a nonsmoking area.  If you are a heavy smoker, ask your health care provider about a prescription for nicotine chewing gum. It can ease your withdrawal from nicotine.  Reward yourself. Set aside the cigarette money you save and buy yourself something nice.  Look for support from others. Join a support group or smoking cessation program. Ask someone at home or at work to help you with your plan   to quit smoking.  Always ask yourself, "Do I need this cigarette or is this just a reflex?" Tell yourself, "Today, I choose not to smoke," or "I do not want to smoke." You are reminding yourself of your decision to quit.  Do not replace cigarette smoking with electronic cigarettes (commonly called e-cigarettes). The safety of e-cigarettes is unknown, and some may contain harmful chemicals.  If you relapse, do not give up! Plan ahead and think about what you will do the next time you get the urge to smoke. HOW WILL I FEEL WHEN I QUIT SMOKING? You  may have symptoms of withdrawal because your body is used to nicotine (the addictive substance in cigarettes). You may crave cigarettes, be irritable, feel very hungry, cough often, get headaches, or have difficulty concentrating. The withdrawal symptoms are only temporary. They are strongest when you first quit but will go away within 10-14 days. When withdrawal symptoms occur, stay in control. Think about your reasons for quitting. Remind yourself that these are signs that your body is healing and getting used to being without cigarettes. Remember that withdrawal symptoms are easier to treat than the major diseases that smoking can cause.  Even after the withdrawal is over, expect periodic urges to smoke. However, these cravings are generally short lived and will go away whether you smoke or not. Do not smoke! WHAT RESOURCES ARE AVAILABLE TO HELP ME QUIT SMOKING? Your health care provider can direct you to community resources or hospitals for support, which may include:  Group support.  Education.  Hypnosis.  Therapy.   This information is not intended to replace advice given to you by your health care provider. Make sure you discuss any questions you have with your health care provider.   Document Released: 05/18/2004 Document Revised: 09/10/2014 Document Reviewed: 02/05/2013 Elsevier Interactive Patient Education 2016 [employer] Inc[employer]  

## 2016-03-30 DIAGNOSIS — R05 Cough: Secondary | ICD-10-CM | POA: Diagnosis not present

## 2016-03-30 DIAGNOSIS — R0782 Intercostal pain: Secondary | ICD-10-CM | POA: Diagnosis not present

## 2016-03-30 DIAGNOSIS — R062 Wheezing: Secondary | ICD-10-CM | POA: Diagnosis not present

## 2016-03-30 DIAGNOSIS — J441 Chronic obstructive pulmonary disease with (acute) exacerbation: Secondary | ICD-10-CM | POA: Diagnosis not present

## 2016-06-27 ENCOUNTER — Encounter: Payer: Self-pay | Admitting: Pediatrics

## 2016-06-27 ENCOUNTER — Ambulatory Visit (INDEPENDENT_AMBULATORY_CARE_PROVIDER_SITE_OTHER): Payer: BLUE CROSS/BLUE SHIELD | Admitting: Pediatrics

## 2016-06-27 VITALS — BP 136/81 | HR 68 | Temp 97.5°F | Resp 18 | Ht 61.0 in | Wt 128.2 lb

## 2016-06-27 DIAGNOSIS — R0602 Shortness of breath: Secondary | ICD-10-CM

## 2016-06-27 DIAGNOSIS — Z23 Encounter for immunization: Secondary | ICD-10-CM | POA: Diagnosis not present

## 2016-06-27 DIAGNOSIS — Z720 Tobacco use: Secondary | ICD-10-CM | POA: Diagnosis not present

## 2016-06-27 DIAGNOSIS — Z1211 Encounter for screening for malignant neoplasm of colon: Secondary | ICD-10-CM

## 2016-06-27 DIAGNOSIS — E785 Hyperlipidemia, unspecified: Secondary | ICD-10-CM | POA: Diagnosis not present

## 2016-06-27 DIAGNOSIS — M25531 Pain in right wrist: Secondary | ICD-10-CM

## 2016-06-27 DIAGNOSIS — M25532 Pain in left wrist: Secondary | ICD-10-CM

## 2016-06-27 DIAGNOSIS — Z0000 Encounter for general adult medical examination without abnormal findings: Secondary | ICD-10-CM | POA: Diagnosis not present

## 2016-06-27 MED ORDER — BUPROPION HCL ER (SMOKING DET) 150 MG PO TB12: 150.0000 mg | ORAL_TABLET | Freq: Two times a day (BID) | ORAL | 1 refills | Status: DC

## 2016-06-27 NOTE — Progress Notes (Signed)
Subjective:   Patient ID: Holly Macdonald, female    DOB: [birth year], 50 y.o.   MRN: [id number] CC: New Patient (Initial Visit) and Shortness of Breath (COPD)  HPI: Holly Macdonald is a 51 y.o. female presenting for New Patient (Initial Visit) and Shortness of Breath (COPD)  COPD: Diagnosed with COPD years ago No PFTs On advair and albuterol back then, wasn't on it for very long  Breathing when sitting is fine When up and moving around, gets tired quickly, out of breath quickly Will cough some Gets catch in her R shoulder blade Wakes up in the middle of the night often Cant fall asleep, goes to bed at 9, falls 10, wakes up around 4  Smoking now about 1 ppd Tried patches, helped a lot Stress at home increased  Depression: no medication for 4 years Was on medication in the past, on lamictal and seroquel Feels like she has minimal symptoms now  Wheezing sometimes, albuteorl helps sometimes  Joint pain in wrists bl Bothers her at night Used to work as a Biomedical scientist, lifting pots and pans a lot Taking tylenol for pain No finger numbness or tingling  HCM: Never had colonoscopy Due for mammogram S/p hysterectomy  Past Medical History:  Diagnosis Date  . Arthritis   . COPD (chronic obstructive pulmonary disease) ([facility])    Family History  Problem Relation Age of Onset  . Cancer Mother   . COPD Mother   . Hearing loss Father   . Early death Son   . Mental illness Son   . Depression Son   . Cancer Maternal Aunt   . COPD Paternal Uncle   . Cancer Maternal Grandmother   . COPD Maternal Grandmother   . Varicose Veins Maternal Grandfather   . Arthritis Paternal Grandmother   . COPD Paternal Grandfather   . Hearing loss Paternal Grandfather   . Cancer Maternal Aunt    Social History   Social History  . Marital status: Single    Spouse name: N/A  . Number of children: N/A  . Years of education: N/A   Social History Main Topics  . Smoking status: Current Every Day Smoker  .  Smokeless tobacco: Never Used  . Alcohol use 0.6 oz/week    1 Cans of beer per week  . Drug use: No  . Sexual activity: No   Other Topics Concern  . None   Social History Narrative  . None   ROS: All systems negative other than what is in HPI  Objective:    BP 136/81   Pulse 68   Temp 97.5 F (36.4 C) (Oral)   Resp 18   Ht _0  (1.549 m)   Wt 128 lb 3.2 oz (58.2 kg)   SpO2 99%   BMI 24.22 kg/m   Wt Readings from Last 3 Encounters:  06/27/16 128 lb 3.2 oz (58.2 kg)  09/21/15 123 lb 6.4 oz (56 kg)  07/21/14 116 lb (52.6 kg)    Gen: NAD, alert, cooperative with exam, NCAT EYES: EOMI, no conjunctival injection, or no icterus ENT:  TMs pearly gray b/l, OP without erythema LYMPH: no cervical LAD CV: NRRR, normal S1/S2, no murmur, distal pulses 2+ b/l Resp: CTABL, no wheezes, normal WOB Abd: +BS, soft, NTND. no guarding or organomegaly Ext: No edema, warm Neuro: Alert and oriented, strength equal b/l UE and LE, coordination grossly normal MSK: normal muscle bulk  Assessment & Plan:  Holly Macdonald was seen today for new patient (initial  visit) and shortness of breath.  Diagnoses and all orders for this visit:  Encounter for preventive health examination - mammogram ordered, phone number to schedule given  Tobacco use Discussed smoking cessation strategies Will do trial of buprpion -     buPROPion (ZYBAN) 150 MG 12 hr tablet; Take 1 tablet (150 mg total) by mouth 2 (two) times daily.  Pain in both wrists NSAIDs, rest, ice prn  Shortness of breath Mild obstructive disease on PFTs Cont to work on smoking cessation -     PR BREATHING CAPACITY TEST  Hyperlipidemia, unspecified hyperlipidemia type Check lipid panel -     BMP8+EGFR -     Lipid panel  Screen for colon cancer Never had colonoscopy, pt does not want to have one now, will consider in future, will do FIT test as below  No family history of colon ca -     Fecal occult blood, imunochemical;  Future  Encounter for immunization -     Flu Vaccine QUAD 36+ mos IM  Follow up plan: Return in about 3 months (around 09/27/2016) for med follow up. Assunta Found, MD [facility]

## 2016-06-27 NOTE — Patient Instructions (Addendum)
[facility] Appointment: [phone]  Start bupropion 2 weeks before smoking quit date Take once daily for 3 days then twice a day  Take naproxen or aleve twice a day for wrist pain Can try aspercream or anacreme topical pain relievers as well

## 2016-06-28 LAB — LIPID PANEL
CHOL/HDL RATIO: 5 ratio — AB (ref 0.0–4.4)
Cholesterol, Total: 233 mg/dL — ABNORMAL HIGH (ref 100–199)
HDL: 47 mg/dL (ref >39)
LDL CALC: 157 mg/dL — AB (ref 0–99)
TRIGLYCERIDES: 146 mg/dL (ref 0–149)
VLDL Cholesterol Cal: 29 mg/dL (ref 5–40)

## 2016-06-28 LAB — BMP8+EGFR
BUN/Creatinine Ratio: 6 — ABNORMAL LOW (ref 9–23)
BUN: 5 mg/dL — ABNORMAL LOW (ref 6–24)
CHLORIDE: 102 mmol/L (ref 96–106)
CO2: 25 mmol/L (ref 18–29)
Calcium: 9.5 mg/dL (ref 8.7–10.2)
Creatinine, Ser: 0.86 mg/dL (ref 0.57–1.00)
GFR calc Af Amer: 90 mL/min/{1.73_m2} (ref >59)
GFR calc non Af Amer: 78 mL/min/{1.73_m2} (ref >59)
GLUCOSE: 89 mg/dL (ref 65–99)
POTASSIUM: 4.9 mmol/L (ref 3.5–5.2)
SODIUM: 141 mmol/L (ref 134–144)

## 2016-07-04 ENCOUNTER — Encounter: Payer: Self-pay | Admitting: Pediatrics

## 2016-08-08 ENCOUNTER — Ambulatory Visit
Admission: RE | Admit: 2016-08-08 | Discharge: 2016-08-08 | Disposition: A | Discharge status: Home / Self Care | Payer: BLUE CROSS/BLUE SHIELD | Source: Ambulatory Visit | Attending: Pediatrics | Admitting: Pediatrics

## 2016-08-08 DIAGNOSIS — Z1231 Encounter for screening mammogram for malignant neoplasm of breast: Secondary | ICD-10-CM | POA: Diagnosis not present

## 2016-08-08 DIAGNOSIS — Z0000 Encounter for general adult medical examination without abnormal findings: Secondary | ICD-10-CM

## 2016-08-09 ENCOUNTER — Other Ambulatory Visit: Payer: Self-pay | Admitting: Pediatrics

## 2016-08-09 DIAGNOSIS — R928 Other abnormal and inconclusive findings on diagnostic imaging of breast: Secondary | ICD-10-CM

## 2016-08-14 ENCOUNTER — Ambulatory Visit
Admission: RE | Admit: 2016-08-14 | Discharge: 2016-08-14 | Disposition: A | Discharge status: Home / Self Care | Payer: BLUE CROSS/BLUE SHIELD | Source: Ambulatory Visit | Attending: Pediatrics | Admitting: Pediatrics

## 2016-08-14 DIAGNOSIS — R928 Other abnormal and inconclusive findings on diagnostic imaging of breast: Secondary | ICD-10-CM

## 2016-09-11 ENCOUNTER — Other Ambulatory Visit: Payer: Self-pay | Admitting: Pediatrics

## 2016-09-11 DIAGNOSIS — Z720 Tobacco use: Secondary | ICD-10-CM

## 2016-10-22 ENCOUNTER — Encounter: Payer: Self-pay | Admitting: Pediatrics

## 2016-11-21 DIAGNOSIS — H524 Presbyopia: Secondary | ICD-10-CM | POA: Diagnosis not present

## 2017-01-12 NOTE — Progress Notes (Signed)
Patient aware to return stool card 

## 2017-01-15 ENCOUNTER — Telehealth: Payer: Self-pay | Admitting: Pediatrics

## 2017-01-15 DIAGNOSIS — Z1211 Encounter for screening for malignant neoplasm of colon: Secondary | ICD-10-CM

## 2017-01-15 NOTE — Telephone Encounter (Signed)
Patient was last seen 06/27/2016, please advise.

## 2017-01-16 ENCOUNTER — Encounter: Payer: Self-pay | Admitting: Gastroenterology

## 2017-01-16 NOTE — Telephone Encounter (Signed)
Please let pt know below

## 2017-01-16 NOTE — Telephone Encounter (Signed)
Aware, referral done.

## 2017-01-16 NOTE — Telephone Encounter (Signed)
I put in the referral for GI. Due for follow up appt at [facility]. She is due for screening test for hepatitis C with next blood draw, there is not a vaccine for hep c.

## 2017-02-18 ENCOUNTER — Ambulatory Visit (AMBULATORY_SURGERY_CENTER): Payer: Self-pay

## 2017-02-18 ENCOUNTER — Encounter: Payer: Self-pay | Admitting: Gastroenterology

## 2017-02-18 VITALS — Ht 62.0 in | Wt 122.8 lb

## 2017-02-18 DIAGNOSIS — Z1211 Encounter for screening for malignant neoplasm of colon: Secondary | ICD-10-CM

## 2017-02-18 MED ORDER — NA SULFATE-K SULFATE-MG SULF 17.5-3.13-1.6 GM/177ML PO SOLN: 1.0000 | Freq: Once | ORAL | 0 refills | Status: AC

## 2017-02-18 NOTE — Progress Notes (Signed)
Denies allergies to eggs or soy products. Denies complication of anesthesia or sedation. Denies use of weight loss medication. Denies use of O2.   Emmi instructions given for colonoscopy.  

## 2017-03-04 ENCOUNTER — Ambulatory Visit (AMBULATORY_SURGERY_CENTER): Payer: BLUE CROSS/BLUE SHIELD | Admitting: Gastroenterology

## 2017-03-04 ENCOUNTER — Encounter: Payer: Self-pay | Admitting: Gastroenterology

## 2017-03-04 VITALS — BP 97/60 | HR 60 | Temp 97.8°F | Resp 17 | Ht 62.0 in | Wt 122.0 lb

## 2017-03-04 DIAGNOSIS — D124 Benign neoplasm of descending colon: Secondary | ICD-10-CM

## 2017-03-04 DIAGNOSIS — Z1211 Encounter for screening for malignant neoplasm of colon: Secondary | ICD-10-CM

## 2017-03-04 DIAGNOSIS — D129 Benign neoplasm of anus and anal canal: Secondary | ICD-10-CM

## 2017-03-04 DIAGNOSIS — K621 Rectal polyp: Secondary | ICD-10-CM

## 2017-03-04 DIAGNOSIS — Z1212 Encounter for screening for malignant neoplasm of rectum: Secondary | ICD-10-CM

## 2017-03-04 DIAGNOSIS — D123 Benign neoplasm of transverse colon: Secondary | ICD-10-CM

## 2017-03-04 DIAGNOSIS — K635 Polyp of colon: Secondary | ICD-10-CM

## 2017-03-04 DIAGNOSIS — D128 Benign neoplasm of rectum: Secondary | ICD-10-CM

## 2017-03-04 MED ORDER — SODIUM CHLORIDE 0.9 % IV SOLN: 500.0000 mL | INTRAVENOUS | Status: DC

## 2017-03-04 NOTE — Progress Notes (Signed)
To recovery, report to Thomas, RN, VSS 

## 2017-03-04 NOTE — Op Note (Signed)
[facility] Patient Name: Holly Macdonald Procedure Date: 03/04/2017 8:58 AM MRN: [id number] Endoscopist: Mauri Pole , MD Age: 51 Referring MD:  Date of Birth: 06-20-1966 Gender: Female Account #: [id number] Procedure:                Colonoscopy Indications:              Screening for colorectal malignant neoplasm, This                            is the patient's first colonoscopy Medicines:                Monitored Anesthesia Care Procedure:                Pre-Anesthesia Assessment:                           - Prior to the procedure, a History and Physical                            was performed, and patient medications and                            allergies were reviewed. The patient's tolerance of                            previous anesthesia was also reviewed. The risks                            and benefits of the procedure and the sedation                            options and risks were discussed with the patient.                            All questions were answered, and informed consent                            was obtained. Prior Anticoagulants: The patient has                            taken no previous anticoagulant or antiplatelet                            agents. ASA Grade Assessment: II - A patient with                            mild systemic disease. After reviewing the risks                            and benefits, the patient was deemed in                            satisfactory condition to undergo the procedure.  After obtaining informed consent, the colonoscope                            was passed under direct vision. Throughout the                            procedure, the patient's blood pressure, pulse, and                            oxygen saturations were monitored continuously. The                            Colonoscope was introduced through the anus and                            advanced to the the  terminal ileum, with                            identification of the appendiceal orifice and IC                            valve. The colonoscopy was performed without                            difficulty. The patient tolerated the procedure                            well. The quality of the bowel preparation was                            excellent. The terminal ileum, ileocecal valve,                            appendiceal orifice, and rectum were photographed. Scope In: 9:05:00 AM Scope Out: 9:19:41 AM Scope Withdrawal Time: 0 hours 12 minutes 10 seconds  Total Procedure Duration: 0 hours 14 minutes 41 seconds  Findings:                 The perianal and digital rectal examinations were                            normal.                           Two sessile polyps were found in the rectum and                            proximal transverse colon. The polyps were 4 to 7                            mm in size. These polyps were removed with a cold                            snare. Resection and retrieval were complete.  A 2 mm polyp was found in the descending colon. The                            polyp was sessile. The polyp was removed with a                            cold biopsy forceps. Resection and retrieval were                            complete.                           Scattered small-mouthed diverticula were found in                            the sigmoid colon, descending colon, transverse                            colon and ascending colon.                           Non-bleeding internal hemorrhoids were found during                            retroflexion. The hemorrhoids were small.                           The exam was otherwise without abnormality. Complications:            No immediate complications. Estimated Blood Loss:     Estimated blood loss was minimal. Impression:               - Two 4 to 7 mm polyps in the rectum and in the                             proximal transverse colon, removed with a cold                            snare. Resected and retrieved.                           - One 2 mm polyp in the descending colon, removed                            with a cold biopsy forceps. Resected and retrieved.                           - Diverticulosis in the sigmoid colon, in the                            descending colon, in the transverse colon and in                            the ascending colon.                           -  Non-bleeding internal hemorrhoids.                           - The examination was otherwise normal. Recommendation:           - Patient has a contact number available for                            emergencies. The signs and symptoms of potential                            delayed complications were discussed with the                            patient. Return to normal activities tomorrow.                            Written discharge instructions were provided to the                            patient.                           - Resume previous diet.                           - Continue present medications.                           - Await pathology results.                           - Repeat colonoscopy in 5-10 years for surveillance                            based on pathology results. Mauri Pole, MD 03/04/2017 9:24:32 AM This report has been signed electronically.

## 2017-03-04 NOTE — Patient Instructions (Signed)
Handouts given on polyps, diverticulosis and hemorrhoids   YOU HAD AN ENDOSCOPIC PROCEDURE TODAY: Refer to the procedure report and other information in the discharge instructions given to you for any specific questions about what was found during the examination. If this information does not answer your questions, please call [facility] office at [phone] to clarify.   YOU SHOULD EXPECT: Some feelings of bloating in the abdomen. Passage of more gas than usual. Walking can help get rid of the air that was put into your GI tract during the procedure and reduce the bloating. If you had a lower endoscopy (such as a colonoscopy or flexible sigmoidoscopy) you may notice spotting of blood in your stool or on the toilet paper. Some abdominal soreness may be present for a day or two, also.  DIET: Your first meal following the procedure should be a light meal and then it is ok to progress to your normal diet. A half-sandwich or bowl of soup is an example of a good first meal. Heavy or fried foods are harder to digest and may make you feel nauseous or bloated. Drink plenty of fluids but you should avoid alcoholic beverages for 24 hours. If you had a esophageal dilation, please see attached instructions for diet.    ACTIVITY: Your care partner should take you home directly after the procedure. You should plan to take it easy, moving slowly for the rest of the day. You can resume normal activity the day after the procedure however YOU SHOULD NOT DRIVE, use power tools, machinery or perform tasks that involve climbing or major physical exertion for 24 hours (because of the sedation medicines used during the test).   SYMPTOMS TO REPORT IMMEDIATELY: A gastroenterologist can be reached at any hour. Please call [phone]  for any of the following symptoms:  Following lower endoscopy (colonoscopy, flexible sigmoidoscopy) Excessive amounts of blood in the stool  Significant tenderness, worsening of abdominal pains   Swelling of the abdomen that is new, acute  Fever of 100 or higher    FOLLOW UP:  If any biopsies were taken you will be contacted by phone or by letter within the next 1-3 weeks. Call [phone]  if you have not heard about the biopsies in 3 weeks.  Please also call with any specific questions about appointments or follow up tests.  

## 2017-03-04 NOTE — Progress Notes (Signed)
Called to room to assist during endoscopic procedure.  Patient ID and intended procedure confirmed with present staff. Received instructions for my participation in the procedure from the performing physician.  

## 2017-03-04 NOTE — Progress Notes (Signed)
Pt's states no medical or surgical changes since previsit or office visit. 

## 2017-03-05 ENCOUNTER — Telehealth: Payer: Self-pay | Admitting: *Deleted

## 2017-03-05 NOTE — Telephone Encounter (Signed)
  Follow up Call-  Call back number 03/04/2017  Post procedure Call Back phone  # [phone]  Permission to leave phone message Yes  Some recent data might be hidden     Patient questions:  Do you have a fever, pain , or abdominal swelling? No. Pain Score  0 *  Have you tolerated food without any problems? Yes.    Have you been able to return to your normal activities? Yes.    Do you have any questions about your discharge instructions: Diet   No. Medications  No. Follow up visit  No.  Do you have questions or concerns about your Care? No.  Actions: * If pain score is 4 or above: No action needed, pain <4.

## 2017-03-12 ENCOUNTER — Encounter: Payer: Self-pay | Admitting: Gastroenterology

## 2017-04-29 ENCOUNTER — Ambulatory Visit (INDEPENDENT_AMBULATORY_CARE_PROVIDER_SITE_OTHER): Payer: BLUE CROSS/BLUE SHIELD | Admitting: Pediatrics

## 2017-04-29 ENCOUNTER — Encounter: Payer: Self-pay | Admitting: Pediatrics

## 2017-04-29 VITALS — BP 124/70 | HR 60 | Temp 97.5°F | Resp 18 | Ht 62.0 in | Wt 122.4 lb

## 2017-04-29 DIAGNOSIS — J069 Acute upper respiratory infection, unspecified: Secondary | ICD-10-CM | POA: Diagnosis not present

## 2017-04-29 DIAGNOSIS — Z720 Tobacco use: Secondary | ICD-10-CM

## 2017-04-29 DIAGNOSIS — R002 Palpitations: Secondary | ICD-10-CM | POA: Diagnosis not present

## 2017-04-29 DIAGNOSIS — K219 Gastro-esophageal reflux disease without esophagitis: Secondary | ICD-10-CM

## 2017-04-29 DIAGNOSIS — G479 Sleep disorder, unspecified: Secondary | ICD-10-CM | POA: Diagnosis not present

## 2017-04-29 MED ORDER — BUPROPION HCL ER (SR) 150 MG PO TB12: 150.0000 mg | ORAL_TABLET | Freq: Two times a day (BID) | ORAL | 3 refills | Status: DC

## 2017-04-29 MED ORDER — RANITIDINE HCL 150 MG PO CAPS: 150.0000 mg | ORAL_CAPSULE | Freq: Two times a day (BID) | ORAL | 3 refills | Status: DC

## 2017-04-29 NOTE — Progress Notes (Signed)
  Subjective:   Patient ID: Holly Macdonald, female    DOB: 1965-11-22, 51 y.o.   MRN: [id number] CC: Trouble sleeping and Shortness of Breath (When sleeping)  HPI: Holly Macdonald is a 51 y.o. female presenting for Trouble sleeping and Shortness of Breath (When sleeping)  Had sleep apnea using a CPAP machine about 5 yrs ago when weight was apprx 150-160 lbs Used for about 2 years Repeat sleep study she says she was told didn't need CPAP machine When first dx with OSA she was waking up coughing/choking gasping for air in the middle of the night Not waking up with those symptoms now  About 1.5 weeks ago has been waking up more often, isnt sure why No pauses with breathing, she does snore she says her husband tells her Has had a runny noise, more hoarseness in her voice Has felt an extra heart beat at night when she is trying to go to sleep for about the past week Has had congestion, coughing some, more than usual for the past week Her voice is slightly hoarse today Smoking 5-6 cig a day, down from 1.5 ppd wellbutrin was helpful with decrease but has since run out  More SOB with exertion for the past week Has used albuterol about 4 times, makes her feel shaky, does help with breathing some No chest pain or chest pressure  Relevant past medical, surgical, family and social history reviewed. Allergies and medications reviewed and updated. History  Smoking Status  . Current Every Day Smoker  Smokeless Tobacco  . Never Used   ROS: Per HPI   Objective:    BP 124/70   Pulse 60   Temp (!) 97.5 F (36.4 C) (Oral)   Resp 18   Ht 5\' 2"  (1.575 m)   Wt 122 lb 6.4 oz (55.5 kg)   SpO2 100%   BMI 22.39 kg/m   Wt Readings from Last 3 Encounters:  04/29/17 122 lb 6.4 oz (55.5 kg)  03/04/17 122 lb (55.3 kg)  02/18/17 122 lb 12.8 oz (55.7 kg)    Gen: NAD, alert, cooperative with exam, NCAT, voice slightly hoarse EYES: EOMI, no conjunctival injection, or no icterus ENT:  TMs pearly gray  b/l, OP without erythema LYMPH: no cervical LAD CV: NRRR, normal S1/S2, no murmur, distal pulses 2+ b/l Resp: CTABL, no wheezes, normal WOB Abd: +BS, soft, NTND. no guarding or organomegaly Ext: No edema, warm Neuro: Alert and oriented, strength equal b/l UE and LE, coordination grossly normal MSK: normal muscle bulk  Assessment & Plan:  Holly Macdonald was seen today for trouble sleeping and shortness of breath.  Diagnoses and all orders for this visit:  Acute URI Discussed symptom care, return precautions Cont albuterol prn  Tobacco use Discussed cessation strategies, has been decreasing, not smoking in house, not in car since got a new car Husband quitting as well Start below, has helped in the past -     buPROPion (WELLBUTRIN SR) 150 MG 12 hr tablet; Take 1 tablet (150 mg total) by mouth 2 (two) times daily.  Heart palpitations -     EKG 12-Lead  Trouble in sleeping Likely due to URI symptom worsening Can try melatonin, discussed sleep hygiene No sleep apnea symptoms now, has had in the past Let me know if any worsening  Follow up plan: Return in about 6 weeks (around 06/10/2017) for well exam/follow up. Assunta Found, MD [place]

## 2017-07-01 DIAGNOSIS — Z23 Encounter for immunization: Secondary | ICD-10-CM | POA: Diagnosis not present

## 2018-02-26 ENCOUNTER — Other Ambulatory Visit: Payer: Self-pay | Admitting: Pediatrics

## 2018-02-26 DIAGNOSIS — K219 Gastro-esophageal reflux disease without esophagitis: Secondary | ICD-10-CM

## 2018-02-26 DIAGNOSIS — Z720 Tobacco use: Secondary | ICD-10-CM

## 2018-02-28 ENCOUNTER — Encounter: Payer: Self-pay | Admitting: *Deleted

## 2018-03-24 ENCOUNTER — Ambulatory Visit: Payer: BLUE CROSS/BLUE SHIELD | Admitting: Pediatrics

## 2018-03-24 ENCOUNTER — Encounter: Payer: Self-pay | Admitting: Pediatrics

## 2018-03-24 VITALS — BP 143/90 | HR 73 | Temp 97.9°F | Resp 20 | Ht 62.0 in | Wt 117.2 lb

## 2018-03-24 DIAGNOSIS — R030 Elevated blood-pressure reading, without diagnosis of hypertension: Secondary | ICD-10-CM | POA: Diagnosis not present

## 2018-03-24 DIAGNOSIS — Z720 Tobacco use: Secondary | ICD-10-CM | POA: Diagnosis not present

## 2018-03-24 DIAGNOSIS — J449 Chronic obstructive pulmonary disease, unspecified: Secondary | ICD-10-CM | POA: Diagnosis not present

## 2018-03-24 DIAGNOSIS — G4700 Insomnia, unspecified: Secondary | ICD-10-CM | POA: Diagnosis not present

## 2018-03-24 MED ORDER — VARENICLINE TARTRATE 0.5 MG X 11 & 1 MG X 42 PO MISC: ORAL | 0 refills | Status: DC

## 2018-03-24 MED ORDER — BUDESONIDE-FORMOTEROL FUMARATE 80-4.5 MCG/ACT IN AERO: 2.0000 | INHALATION_SPRAY | Freq: Two times a day (BID) | RESPIRATORY_TRACT | 3 refills | Status: DC

## 2018-03-24 NOTE — Progress Notes (Signed)
Subjective:   Patient ID: Holly Macdonald, female    DOB: 01-Sep-1966, 52 y.o.   MRN: [id number] CC: Medical Management of Chronic Issues; Trouble sleeping; and Shortness of Breath  HPI: Holly Macdonald is a 52 y.o. female   Tobacco use: Smoking about a pack a day.  Has tried quitting with Wellbutrin, patches in the past.  Patches seem to make her feel more ramped up.  Shortness of breath: When she goes outside in the heat she feels more short of breath.  She is been taking albuterol every other day for the last few weeks.  The albuterol helps sometimes.  She had PFTs with the last couple years showing some mild obstruction.  She is not on a controller medicine.  Insomnia: She tries to get to bed around 930 to 10:00.  She does not fall asleep she gets up, watches TV.  Often does not fall asleep until after midnight, 1 AM.  She has to wake up at 530 the next morning.  She has a history of sleep apnea when her weight was much greater than it is now.  She had a follow-up sleep study that showed no REM sleep she was told to be normal, and resolution of the sleep apnea.  She has been taking 50 mg of Benadryl at night about 5 days a week.  Sometimes she feels restless after getting to bed after taking the Benadryl.  She does not think that she worries a lot.  She does not feel anxious.  No chest tightness with exertion.  She is trying to walk some every day.  She does not check her blood pressure at home.  Relevant past medical, surgical, family and social history reviewed. Allergies and medications reviewed and updated. Social History   Tobacco Use  Smoking Status Current Every Day Smoker  Smokeless Tobacco Never Used   ROS: Per HPI   Objective:    BP (!) 143/90   Pulse 73   Temp 97.9 F (36.6 C) (Oral)   Resp 20   Ht 5\' 2"  (1.575 m)   Wt 117 lb 3.2 oz (53.2 kg)   SpO2 100%   BMI 21.44 kg/m   Wt Readings from Last 3 Encounters:  03/24/18 117 lb 3.2 oz (53.2 kg)  04/29/17 122 lb 6.4 oz  (55.5 kg)  03/04/17 122 lb (55.3 kg)    Gen: NAD, alert, cooperative with exam, NCAT EYES: EOMI, no conjunctival injection, or no icterus ENT: OP without erythema LYMPH: no cervical LAD CV: NRRR, normal S1/S2, no murmur, distal pulses 2+ b/l Resp: CTABL, no wheezes, normal WOB Abd: +BS, soft, NTND.  Ext: No edema, warm Neuro: Alert and oriented MSK: normal muscle bulk  Assessment & Plan:  Holly Macdonald was seen today for medical management of chronic issues, trouble sleeping and shortness of breath.  Diagnoses and all orders for this visit:  Chronic obstructive pulmonary disease, unspecified COPD type ([place]) Overusing albuterol.  Will start below. -     budesonide-formoterol (SYMBICORT) 80-4.5 MCG/ACT inhaler; Inhale 2 puffs into the lungs 2 (two) times daily.  Tobacco use Discussed strategies, continue to encourage cessation.  Stop Wellbutrin.  Start below. -     varenicline (CHANTIX PAK) 0.5 MG X 11 & 1 MG X 42 tablet; Take one 0.5 mg tab once daily for 3 days, then increase to one 0.5 mg tab twice daily for 4 days, then increase to one 1 mg tab twice daily  Insomnia, unspecified type Sleep hygiene discussed.  No screens within an hour before bedtime or if she can sleep at night.  Elevated blood pressure reading Return for nurse visit in 2 weeks for repeat blood pressure.  Remains elevated will start blood pressure medicine.  Follow up plan: Return in about 3 months regular office visit.  (around 06/24/2018). Assunta Found, MD [place]

## 2018-03-24 NOTE — Patient Instructions (Signed)
Come have in 2 weeks for nurse visit for repeat blood pressure. Come back to see me in 3 mo

## 2018-04-27 ENCOUNTER — Other Ambulatory Visit: Payer: Self-pay | Admitting: Pediatrics

## 2018-04-27 DIAGNOSIS — Z720 Tobacco use: Secondary | ICD-10-CM

## 2018-04-28 ENCOUNTER — Other Ambulatory Visit: Payer: Self-pay

## 2018-04-28 DIAGNOSIS — Z720 Tobacco use: Secondary | ICD-10-CM

## 2018-04-28 NOTE — Telephone Encounter (Signed)
Last seen 03/24/18  Dr Evette Doffing

## 2018-04-29 MED ORDER — VARENICLINE TARTRATE 1 MG PO TABS: 1.0000 mg | ORAL_TABLET | Freq: Two times a day (BID) | ORAL | 1 refills | Status: DC

## 2018-05-01 ENCOUNTER — Ambulatory Visit: Payer: BLUE CROSS/BLUE SHIELD | Admitting: Nurse Practitioner

## 2018-05-01 ENCOUNTER — Encounter: Payer: Self-pay | Admitting: Nurse Practitioner

## 2018-05-01 VITALS — BP 134/88 | HR 59 | Temp 97.1°F | Ht 62.0 in | Wt 120.0 lb

## 2018-05-01 DIAGNOSIS — R519 Headache, unspecified: Secondary | ICD-10-CM

## 2018-05-01 DIAGNOSIS — R51 Headache: Secondary | ICD-10-CM | POA: Diagnosis not present

## 2018-05-01 NOTE — Progress Notes (Signed)
   Subjective:    Patient ID: Holly Macdonald, female    DOB: 1966/06/03, 51 y.o.   MRN: [id number]   Chief Complaint: BP high at home and Headache   HPI Patient come sin c/o elevated blood pressure with headache. Blood pressure yesterday was 130/94. She is not on any blood pressure meds She has a slight headache today. She was recently put on chantix to quit smoking. She has cut back but has not stopped. She has also recently almost completely stopped caffeine. tylnol works for headache - completely gone right now.   Review of Systems  Constitutional: Negative.   HENT: Negative.   Eyes: Negative for photophobia and visual disturbance.  Gastrointestinal: Negative for nausea and vomiting.  Skin: Negative.   Neurological: Positive for headaches.  Psychiatric/Behavioral: Negative.   All other systems reviewed and are negative.      Objective:   Physical Exam  Constitutional: She appears well-developed and well-nourished. No distress.  HENT:  Head: Normocephalic.  Eyes: Pupils are equal, round, and reactive to light. EOM are normal.  Neck: Normal range of motion. Neck supple.  Cardiovascular: Normal rate.   BP 134/88 (BP Location: Left Arm)   Pulse (!) 59   Temp (!) 97.1 F (36.2 C) (Oral)   Ht 5\' 2"  (1.575 m)   Wt 120 lb (54.4 kg)   BMI 21.95 kg/m       Assessment & Plan:  Holly Macdonald in today with chief complaint of BP high at home and Headache   1. Acute nonintractable headache, unspecified headache type Continue tylenol as needed Headache could be coming from chantix or possible caffeine withdrawal. Keep diary of headaches RTO prn  Mary-Margaret Hassell Done, FNP

## 2018-05-01 NOTE — Patient Instructions (Signed)

## 2018-06-25 ENCOUNTER — Ambulatory Visit: Payer: BLUE CROSS/BLUE SHIELD | Admitting: Pediatrics

## 2018-06-25 ENCOUNTER — Encounter: Payer: Self-pay | Admitting: Pediatrics

## 2018-06-25 VITALS — BP 136/68 | HR 69 | Temp 97.9°F | Ht 62.0 in | Wt 124.4 lb

## 2018-06-25 DIAGNOSIS — Z1159 Encounter for screening for other viral diseases: Secondary | ICD-10-CM | POA: Diagnosis not present

## 2018-06-25 DIAGNOSIS — Z23 Encounter for immunization: Secondary | ICD-10-CM | POA: Diagnosis not present

## 2018-06-25 DIAGNOSIS — R030 Elevated blood-pressure reading, without diagnosis of hypertension: Secondary | ICD-10-CM

## 2018-06-25 DIAGNOSIS — E785 Hyperlipidemia, unspecified: Secondary | ICD-10-CM | POA: Diagnosis not present

## 2018-06-25 DIAGNOSIS — J449 Chronic obstructive pulmonary disease, unspecified: Secondary | ICD-10-CM

## 2018-06-25 DIAGNOSIS — Z87891 Personal history of nicotine dependence: Secondary | ICD-10-CM | POA: Diagnosis not present

## 2018-06-25 DIAGNOSIS — G4700 Insomnia, unspecified: Secondary | ICD-10-CM

## 2018-06-25 DIAGNOSIS — K219 Gastro-esophageal reflux disease without esophagitis: Secondary | ICD-10-CM

## 2018-06-25 MED ORDER — BUDESONIDE-FORMOTEROL FUMARATE 80-4.5 MCG/ACT IN AERO: 2.0000 | INHALATION_SPRAY | Freq: Two times a day (BID) | RESPIRATORY_TRACT | 5 refills | Status: DC

## 2018-06-25 MED ORDER — VARENICLINE TARTRATE 1 MG PO TABS: 1.0000 mg | ORAL_TABLET | Freq: Two times a day (BID) | ORAL | 2 refills | Status: DC

## 2018-06-25 MED ORDER — PANTOPRAZOLE SODIUM 40 MG PO TBEC: 40.0000 mg | DELAYED_RELEASE_TABLET | Freq: Every day | ORAL | 3 refills | Status: DC

## 2018-06-25 NOTE — Progress Notes (Signed)
Subjective:   Patient ID: Holly Macdonald, female    DOB: 03-Sep-1966, 52 y.o.   MRN: [id number] CC: Follow-up (blood pressure )  HPI: Holly Macdonald is a 52 y.o. female   COPD: Has not needed albuterol since being on the Symbicort.  Tobacco use: Quit September 30.  Has been pleased with progress so far.  Chantix has been helping.  Insomnia: Worries a lot at night, stress from family.  She thinks the Chantix has been helping some.  Not interested in additional medicine to help with anxiety right now.  GERD: Taking Zantac twice a day.  Continues to have breakthrough symptoms.  Elevated blood pressure: Top number often 130s, bottom number 50s to 60s when she checks at [facility].  Relevant past medical, surgical, family and social history reviewed. Allergies and medications reviewed and updated. Social History   Tobacco Use  Smoking Status Former Smoker  . Packs/day: 1.50  . Years: 40.00  . Pack years: 60.00  . Types: Cigarettes  . Last attempt to quit: 06/02/2018  . Years since quitting: 0.0  Smokeless Tobacco Never Used   ROS: Per HPI   Objective:    BP 136/68   Pulse 69   Temp 97.9 F (36.6 C)   Ht _0  (1.575 m)   Wt 124 lb 6.4 oz (56.4 kg)   BMI 22.75 kg/m   Wt Readings from Last 3 Encounters:  06/25/18 124 lb 6.4 oz (56.4 kg)  05/01/18 120 lb (54.4 kg)  03/24/18 117 lb 3.2 oz (53.2 kg)    Gen: NAD, alert, cooperative with exam, NCAT EYES: EOMI, no conjunctival injection, or no icterus ENT:   OP without erythema LYMPH: no cervical LAD CV: NRRR, normal S1/S2, no murmur, distal pulses 2+ b/l Resp: CTABL, no wheezes, normal WOB Abd: +BS, soft, NTND. no guarding or organomegaly Ext: No edema, warm Neuro: Alert and oriented, strength equal b/l UE and LE, coordination grossly normal MSK: normal muscle bulk  Assessment & Plan:  Eboni was seen today for follow-up.  Diagnoses and all orders for this visit:  Chronic obstructive pulmonary disease, unspecified COPD  type ([place]) Stable, continue below.  No not smoking, consider trial off of Symbicort after next spring.  Allergy season spring and fall tend to be when she has the most breathing trouble. -     budesonide-formoterol (SYMBICORT) 80-4.5 MCG/ACT inhaler; Inhale 2 puffs into the lungs 2 (two) times daily.  Need for immunization against influenza -     Flu Vaccine QUAD 36+ mos IM  Cessation of tobacco use in previous 12 months Has been doing great on Chantix, continue for another 3 months.   -     varenicline (CHANTIX CONTINUING MONTH PAK) 1 MG tablet; Take 1 tablet (1 mg total) by mouth 2 (two) times daily.  Need for hepatitis C screening test -     Hepatitis C antibody  Hyperlipidemia, unspecified hyperlipidemia type -     Lipid panel  Elevated blood pressure reading Check blood pressures at home.  Let me know if regularly greater than 759F systolic, 63W diastolic -     [id number]  Insomnia, unspecified type After stopping Chantix may need to consider alternative for anxiety.  Continue sleep hygiene.  Gastroesophageal reflux disease, esophagitis presence not specified Stop Zantac, start below.  Take once a day 30 minutes before meal for 2 months.  Then try weaning back off.  May need to use as needed medicine such as Tums or Maalox. -  pantoprazole (PROTONIX) 40 MG tablet; Take 1 tablet (40 mg total) by mouth daily.   Follow up plan: Return in about 6 months (around 12/25/2018). Assunta Found, MD [place]

## 2018-06-26 ENCOUNTER — Ambulatory Visit: Payer: BLUE CROSS/BLUE SHIELD | Admitting: Pediatrics

## 2018-06-26 LAB — LIPID PANEL
CHOLESTEROL TOTAL: 227 mg/dL — AB (ref 100–199)
Chol/HDL Ratio: 4.6 ratio — ABNORMAL HIGH (ref 0.0–4.4)
HDL: 49 mg/dL (ref >39)
LDL Calculated: 146 mg/dL — ABNORMAL HIGH (ref 0–99)
TRIGLYCERIDES: 161 mg/dL — AB (ref 0–149)
VLDL Cholesterol Cal: 32 mg/dL (ref 5–40)

## 2018-06-26 LAB — CMP14+EGFR
A/G RATIO: 1.8 (ref 1.2–2.2)
ALK PHOS: 83 IU/L (ref 39–117)
ALT: 20 IU/L (ref 0–32)
AST: 19 IU/L (ref 0–40)
Albumin: 4.5 g/dL (ref 3.5–5.5)
BILIRUBIN TOTAL: 0.2 mg/dL (ref 0.0–1.2)
BUN/Creatinine Ratio: 14 (ref 9–23)
BUN: 12 mg/dL (ref 6–24)
CHLORIDE: 105 mmol/L (ref 96–106)
CO2: 22 mmol/L (ref 20–29)
Calcium: 9.8 mg/dL (ref 8.7–10.2)
Creatinine, Ser: 0.84 mg/dL (ref 0.57–1.00)
GFR calc Af Amer: 91 mL/min/{1.73_m2} (ref >59)
GFR calc non Af Amer: 79 mL/min/{1.73_m2} (ref >59)
GLUCOSE: 79 mg/dL (ref 65–99)
Globulin, Total: 2.5 g/dL (ref 1.5–4.5)
POTASSIUM: 4.6 mmol/L (ref 3.5–5.2)
Sodium: 142 mmol/L (ref 134–144)
Total Protein: 7 g/dL (ref 6.0–8.5)

## 2018-06-26 LAB — HEPATITIS C ANTIBODY

## 2018-07-30 ENCOUNTER — Encounter: Payer: Self-pay | Admitting: Pediatrics

## 2018-08-04 ENCOUNTER — Ambulatory Visit: Payer: BLUE CROSS/BLUE SHIELD | Admitting: Pediatrics

## 2018-08-04 ENCOUNTER — Encounter: Payer: Self-pay | Admitting: Pediatrics

## 2018-08-04 VITALS — BP 126/86 | HR 85 | Temp 96.8°F | Resp 18 | Ht 62.0 in | Wt 125.2 lb

## 2018-08-04 DIAGNOSIS — J069 Acute upper respiratory infection, unspecified: Secondary | ICD-10-CM

## 2018-08-04 DIAGNOSIS — J329 Chronic sinusitis, unspecified: Secondary | ICD-10-CM | POA: Diagnosis not present

## 2018-08-04 DIAGNOSIS — R0981 Nasal congestion: Secondary | ICD-10-CM | POA: Diagnosis not present

## 2018-08-04 MED ORDER — DOXYCYCLINE HYCLATE 100 MG PO TABS: 100.0000 mg | ORAL_TABLET | Freq: Two times a day (BID) | ORAL | 0 refills | Status: DC

## 2018-08-04 NOTE — Progress Notes (Signed)
  Subjective:   Patient ID: [name], female    DOB: [birth year], 52 y.o.   MRN: [id number] CC: Cough and Chest Congestion  HPI: RAYN SHORB is a 52 y.o. female   Started about 8 to 9 days ago.  Some runny nose, congestion.  Coughing more than usual.  Cough is mostly dry.  No fevers in the last few days.  Appetite has been back to normal last few days.  Still bothered very much by nasal congestion.  Feels like it has been running down her throat anytime she lies down.  Does have some pressure in her sinuses.  Has not needed to take the albuterol much more than usual.  She does have some at home.  Using Symbicort daily.  Relevant past medical, surgical, family and social history reviewed. Allergies and medications reviewed and updated. Social History   Tobacco Use  Smoking Status Former Smoker  . Packs/day: 1.50  . Years: 40.00  . Pack years: 60.00  . Types: Cigarettes  . Last attempt to quit: 06/02/2018  . Years since quitting: 0.1  Smokeless Tobacco Never Used   ROS: Per HPI   Objective:    BP 126/86   Pulse 85   Temp (!) 96.8 F (36 C) (Oral)   Resp 18   Ht 5\' 2"  (1.575 m)   Wt 125 lb 3.2 oz (56.8 kg)   SpO2 99%   BMI 22.90 kg/m   Wt Readings from Last 3 Encounters:  08/04/18 125 lb 3.2 oz (56.8 kg)  06/25/18 124 lb 6.4 oz (56.4 kg)  05/01/18 120 lb (54.4 kg)    Gen: NAD, alert, cooperative with exam, NCAT EYES: EOMI, no conjunctival injection, or no icterus ENT:  TMs pearly gray b/l, OP without erythema, mildly tender to palpation over sinuses LYMPH: no cervical LAD CV: NRRR, normal S1/S2, no murmur, distal pulses 2+ b/l Resp: CTABL, no wheezes, normal WOB Abd: +BS, soft, NTND.  Ext: No edema, warm Neuro: Alert and oriented, strength equal b/l UE and LE, coordination grossly normal MSK: normal muscle bulk  Assessment & Plan:  Halina was seen today for cough and chest congestion.  Diagnoses and all orders for this visit:  Acute URI Discussed symptomatic  care.  Sinus rinses, Flonase, antihistamine use.    Sinus congestion  Sinusitis, unspecified chronicity, unspecified location For worsening sinus symptoms that started over the next few days, okay to start below.  May take another 3 to 4 days before she starts to feel better from acute upper respiratory infection.  Return precautions discussed.   -     doxycycline (VIBRA-TABS) 100 MG tablet; Take 1 tablet (100 mg total) by mouth 2 (two) times daily.   Follow up plan: As needed Assunta Found, MD [place]

## 2018-08-04 NOTE — Patient Instructions (Signed)
Fever reducer and headache: tylenol and ibuprofen, can take together or alternating   Sinus pressure:  Nasal steroid such as flonase/fluticaone or nasocort daily Can also take daily antihistamine such as loratadine/claritin or cetirizine/zyrtec Decongestant such as phenylephrine, but take in the morning or may keep you awake at night  Sinus rinses/irritation: Netipot or similar with distilled water 2-3 times a day to clear out sinuses or Normal saline nasal spray  Sore throat:  Throat lozenges chloroseptic spray  Stick with bland foods Drink lots of fluids

## 2018-11-25 ENCOUNTER — Other Ambulatory Visit: Payer: Self-pay

## 2018-11-25 ENCOUNTER — Telehealth (INDEPENDENT_AMBULATORY_CARE_PROVIDER_SITE_OTHER): Payer: BLUE CROSS/BLUE SHIELD | Admitting: Family Medicine

## 2018-11-25 ENCOUNTER — Encounter: Payer: Self-pay | Admitting: Family Medicine

## 2018-11-25 DIAGNOSIS — J209 Acute bronchitis, unspecified: Secondary | ICD-10-CM

## 2018-11-25 DIAGNOSIS — J440 Chronic obstructive pulmonary disease with acute lower respiratory infection: Secondary | ICD-10-CM

## 2018-11-25 DIAGNOSIS — F17200 Nicotine dependence, unspecified, uncomplicated: Secondary | ICD-10-CM | POA: Diagnosis not present

## 2018-11-25 MED ORDER — DOXYCYCLINE HYCLATE 100 MG PO TABS: 100.0000 mg | ORAL_TABLET | Freq: Two times a day (BID) | ORAL | 0 refills | Status: AC

## 2018-11-25 MED ORDER — VARENICLINE TARTRATE 0.5 MG X 11 & 1 MG X 42 PO MISC: ORAL | 0 refills | Status: DC

## 2018-11-25 MED ORDER — VARENICLINE TARTRATE 1 MG PO TABS: 1.0000 mg | ORAL_TABLET | Freq: Two times a day (BID) | ORAL | 2 refills | Status: DC

## 2018-11-25 MED ORDER — PREDNISONE 20 MG PO TABS: 40.0000 mg | ORAL_TABLET | Freq: Every day | ORAL | 0 refills | Status: AC

## 2018-11-25 NOTE — Progress Notes (Signed)
Telephone visit  Subjective: CC: cough PCP: Janora Norlander, DO Holly Macdonald is a 53 y.o. female calls for telephone consult today. Patient provides verbal consent for consult held via phone.  Location of patient: work Location of provider: [facility] Others present for call: none  1. Cough Patient reports onset of productive cough with clear sputum 3 days ago.  She notes that cough has progressively gotten worse over the last day and now the cough is no longer productive.  She reports sensation of pressure within the chest, harsh cough and sinus drainage and congestion.  She reports that the sinus drainage is clear.  No purulence.  No hemoptysis.  No change in exercise tolerance.  Denies wheezes, nausea, vomiting, chills, myalgia or fever.  No known contact with persons positive for COVID-19.  She has been performing good hygiene practices including distancing by 6 feet and frequent handwashing and wiping down work areas with bleach wipes.  2.  Tobacco use disorder Patient reports that she responded excellently to Chantix and had been in total cessation for a bit before her father had a massive heart attack.  She notes anxiety was a trigger for her starting to smoke again and she is interested in stopping again.  She is asking we can refill the Chantix in efforts to stop smoking.   ROS: Per HPI  Allergies  Allergen Reactions  . Morphine And Related Hives, Shortness Of Breath and Swelling  . Codeine Nausea And Vomiting  . Penicillins Hives   Past Medical History:  Diagnosis Date  . Allergy   . Anxiety   . Arthritis   . COPD (chronic obstructive pulmonary disease) ([place])   . Hyperlipidemia   . Sleep apnea     Current Outpatient Medications:  .  acetaminophen (TYLENOL) 650 MG CR tablet, Take 650 mg by mouth 2 (two) times daily as needed for pain., Disp: , Rfl:  .  albuterol (PROVENTIL HFA;VENTOLIN HFA) 108 (90 Base) MCG/ACT inhaler, Inhale 2 puffs into the lungs every 6 (six)  hours as needed., Disp: , Rfl:  .  budesonide-formoterol (SYMBICORT) 80-4.5 MCG/ACT inhaler, Inhale 2 puffs into the lungs 2 (two) times daily., Disp: 1 Inhaler, Rfl: 5 .  doxycycline (VIBRA-TABS) 100 MG tablet, Take 1 tablet (100 mg total) by mouth 2 (two) times daily., Disp: 14 tablet, Rfl: 0 .  Multiple Vitamin (MULTIVITAMIN) tablet, Take 1 tablet by mouth daily., Disp: , Rfl:  .  pantoprazole (PROTONIX) 40 MG tablet, Take 1 tablet (40 mg total) by mouth daily., Disp: 30 tablet, Rfl: 3 .  varenicline (CHANTIX CONTINUING MONTH PAK) 1 MG tablet, Take 1 tablet (1 mg total) by mouth 2 (two) times daily., Disp: 60 tablet, Rfl: 2  Current Facility-Administered Medications:  .  0.9 %  sodium chloride infusion, 500 mL, Intravenous, Continuous, Nandigam, Venia Minks, MD  Assessment/ Plan: 53 y.o. female   1. Acute bronchitis with chronic obstructive pulmonary disease (COPD) ([place]) I suspect a COPD exacerbation based on her symptoms.  I have sent in doxycycline and prednisone burst.  We discussed home care instructions including use of albuterol inhaler, hydration and rest.  At this time, I think that she is low risk for COVID-19.  However, we did discuss that if symptoms were not improving or she finds out that she has had any contact with persons infected with COVID-19 that isolation would be recommended.  We discussed reasons for emergent evaluation emergency department.  She voiced good understanding will follow-up PRN - doxycycline (  VIBRA-TABS) 100 MG tablet; Take 1 tablet (100 mg total) by mouth 2 (two) times daily for 7 days.  Dispense: 14 tablet; Refill: 0 - predniSONE (DELTASONE) 20 MG tablet; Take 2 tablets (40 mg total) by mouth daily with breakfast for 5 days.  Dispense: 10 tablet; Refill: 0  2. Tobacco use disorder Patient is in the action phase of smoking cessation.  She responded well to Chantix previously and I have refilled this so that she may start cessation again. - varenicline (CHANTIX  STARTING MONTH PAK) 0.5 MG X 11 & 1 MG X 42 tablet; Take 0.5 mg tablet by mouth once daily x3 days, then 0.5 mg tablet twice daily x4 days, then increase to one 1 mg tablet twice daily.  Dispense: 53 tablet; Refill: 0 - varenicline (CHANTIX CONTINUING MONTH PAK) 1 MG tablet; Take 1 tablet (1 mg total) by mouth 2 (two) times daily.  Dispense: 60 tablet; Refill: 2   Start time: 3:10pm End time: 3:18pm  No orders of the defined types were placed in this encounter.   Janora Norlander, DO [place] [phone]

## 2018-11-25 NOTE — Patient Instructions (Signed)
I have sent in medications to cover for COPD exacerbation.  Start the prednisone tomorrow morning with breakfast.  Start the doxycycline tonight with supper.  Make sure that you take the doxycycline twice daily with a meal.  We discussed risk of nausea with this medicine.  Okay to use your albuterol inhaler every 6 hours if needed.  Okay to continue Mucinex if needed for expectorant/cough.  Drink plenty of fluids.  Get good rest.  We discussed reasons for emergent evaluation emergency department.  We discussed that you are unlikely to be infected with COVID-19 at this time but that COVID-19 can be present and even asymptomatic persons.  Please use your best judgment with isolation and hygiene practices.  Coronavirus (COVID-19) Are you at risk?  Are you at risk for the Coronavirus (COVID-19)?  To be considered HIGH RISK for Coronavirus (COVID-19), you have to meet the following criteria:  . Traveled to [place], [place], [place], [place] or [place]; or in the [place] to [place], [place], [place], or [place]; and have fever, cough, and shortness of breath within the last 2 weeks of travel OR . Been in close contact with a person diagnosed with COVID-19 within the last 2 weeks and have fever, cough, and shortness of breath . IF YOU DO NOT MEET THESE CRITERIA, YOU ARE CONSIDERED LOW RISK FOR COVID-19.  What to do if you are HIGH RISK for COVID-19?  Marland Kitchen If you are having a medical emergency, call 911. . Seek medical care right away. Before you go to a doctor's office, urgent care or emergency department, call ahead and tell them about your recent travel, contact with someone diagnosed with COVID-19, and your symptoms. You should receive instructions from your physician's office regarding next steps of care.  . When you arrive at healthcare provider, tell the healthcare staff immediately you have returned from visiting [place], [place], [place], [place] or [place]; or traveled in the [place] to  [place], [place], [place], or [place]; in the last two weeks or you have been in close contact with a person diagnosed with COVID-19 in the last 2 weeks.   . Tell the health care staff about your symptoms: fever, cough and shortness of breath. . After you have been seen by a medical provider, you will be either: o Tested for (COVID-19) and discharged home on quarantine except to seek medical care if symptoms worsen, and asked to  - Stay home and avoid contact with others until you get your results (4-5 days)  - Avoid travel on public transportation if possible (such as bus, train, or airplane) or o Sent to the Emergency Department by EMS for evaluation, COVID-19 testing, and possible admission depending on your condition and test results.  What to do if you are LOW RISK for COVID-19?  Reduce your risk of any infection by using the same precautions used for avoiding the common cold or flu:  Marland Kitchen Wash your hands often with soap and warm water for at least 20 seconds.  If soap and water are not readily available, use an alcohol-based hand sanitizer with at least 60% alcohol.  . If coughing or sneezing, cover your mouth and nose by coughing or sneezing into the elbow areas of your shirt or coat, into a tissue or into your sleeve (not your hands). . Avoid shaking hands with others and consider head nods or verbal greetings only. . Avoid touching your eyes, nose, or mouth with unwashed hands.  Marland Kitchen  Avoid close contact with people who are sick. . Avoid places or events with large numbers of people in one location, like concerts or sporting events. . Carefully consider travel plans you have or are making. . If you are planning any travel outside or inside the [place], visit the CDC's Travelers' Health webpage for the latest health notices. . If you have some symptoms but not all symptoms, continue to monitor at home and seek medical attention if your symptoms worsen. . If you are having a medical  emergency, call 911.   [place] / e-Visit: [email or web address]         [facility]: [facility]: [phone]                   [facility]: [phone]

## 2018-12-05 ENCOUNTER — Encounter: Payer: Self-pay | Admitting: Family Medicine

## 2018-12-05 ENCOUNTER — Telehealth: Payer: BLUE CROSS/BLUE SHIELD | Admitting: Nurse Practitioner

## 2018-12-05 DIAGNOSIS — R0602 Shortness of breath: Secondary | ICD-10-CM

## 2018-12-05 DIAGNOSIS — R071 Chest pain on breathing: Secondary | ICD-10-CM

## 2018-12-05 NOTE — Progress Notes (Signed)
Based on what you shared with me it looks like you could  have pneumonia, pulmonary emboli, somethimg going on with your heart or heartburn,that should be evaluated in a face to face office visit. I suggest you go to er or urgent care to be evaluated.   NOTE: If you entered your credit card information for this eVisit, you will not be charged. You may see a "hold" on your card for the $30 but that hold will drop off and you will not have a charge processed.  If you are having a true medical emergency please call 911.  If you need an urgent face to face visit, [place] has four urgent care centers for your convenience.  If you need care fast and have a high deductible or no insurance consider:   [email or web address] to reserve your spot online an avoid wait times  [facility] [address] [place], [place] [address] 8 am to 8 pm Monday-Friday 10 am to 4 pm Saturday-Sunday *Across the street from [employer]  [place], [address] 8 am to 5 pm Monday-Friday * In the [facility] on the [facility]   The following sites will take your  insurance:  . [facility] [place] a Provider at this Location  [address] [place], [place] [address] . 10 am to 8 pm Monday-Friday . 12 pm to 8 pm Saturday-Sunday   . [facility] [place] a Provider at this Location  [address] [place], [place] [address] . 8 am to 8 pm Monday-Friday . 9 am to 6 pm Saturday . 11 am to 6 pm Sunday   . [facility] at [place] Get Driving Directions  [address] [place],  [address] . 8 am to 8 pm Monday-Friday . 8 am to 4 pm Saturday-Sunday   Your e-visit answers were reviewed by a board certified advanced clinical practitioner  to complete your personal care plan.  5 minutes spent reviewing and documenting in chart.

## 2019-02-03 ENCOUNTER — Ambulatory Visit: Payer: BLUE CROSS/BLUE SHIELD | Admitting: Family Medicine

## 2019-02-03 ENCOUNTER — Ambulatory Visit: Payer: BLUE CROSS/BLUE SHIELD | Admitting: Pediatrics

## 2019-06-01 ENCOUNTER — Ambulatory Visit (INDEPENDENT_AMBULATORY_CARE_PROVIDER_SITE_OTHER): Payer: BLUE CROSS/BLUE SHIELD | Admitting: Family Medicine

## 2019-06-01 ENCOUNTER — Encounter: Payer: Self-pay | Admitting: Family Medicine

## 2019-06-01 ENCOUNTER — Other Ambulatory Visit: Payer: Self-pay

## 2019-06-01 VITALS — BP 148/89

## 2019-06-01 DIAGNOSIS — R030 Elevated blood-pressure reading, without diagnosis of hypertension: Secondary | ICD-10-CM | POA: Insufficient documentation

## 2019-06-01 DIAGNOSIS — J449 Chronic obstructive pulmonary disease, unspecified: Secondary | ICD-10-CM

## 2019-06-01 NOTE — Patient Instructions (Signed)
Palpitations Palpitations are feelings that your heartbeat is not normal. Your heartbeat may feel like it is:  Uneven.  Faster than normal.  Fluttering.  Skipping a beat. This is usually not a serious problem. In some cases, you may need tests to rule out any serious problems. Follow these instructions at home: Pay attention to any changes in your condition. Take these actions to help manage your symptoms: Eating and drinking  Avoid: ? Coffee, tea, soft drinks, and energy drinks. ? Chocolate. ? Alcohol. ? Diet pills. Lifestyle   Try to lower your stress. These things can help you relax: ? Yoga. ? Deep breathing and meditation. ? Exercise. ? Using words and images to create positive thoughts (guided imagery). ? Using your mind to control things in your body (biofeedback).  Do not use drugs.  Get plenty of rest and sleep. Keep a regular bed time. General instructions   Take over-the-counter and prescription medicines only as told by your doctor.  Do not use any products that contain nicotine or tobacco, such as cigarettes and e-cigarettes. If you need help quitting, ask your doctor.  Keep all follow-up visits as told by your doctor. This is important. You may need more tests if palpitations do not go away or get worse. Contact a doctor if:  Your symptoms last more than 24 hours.  Your symptoms occur more often. Get help right away if you:  Have chest pain.  Feel short of breath.  Have a very bad headache.  Feel dizzy.  Pass out (faint). Summary  Palpitations are feelings that your heartbeat is uneven or faster than normal. It may feel like your heart is fluttering or skipping a beat.  Avoid food and drinks that may cause palpitations. These include caffeine, chocolate, and alcohol.  Try to lower your stress. Do not smoke or use drugs.  Get help right away if you faint or have chest pain, shortness of breath, a severe headache, or dizziness. This  information is not intended to replace advice given to you by your health care provider. Make sure you discuss any questions you have with your health care provider. Document Released: 05/29/2008 Document Revised: 10/02/2017 Document Reviewed: 10/02/2017 Elsevier Patient Education  2020 Elsevier Inc.  

## 2019-06-01 NOTE — Progress Notes (Signed)
Telephone visit  Subjective: CC: Elevated BP PCP: Janora Norlander, DO [id number] A Timme is a 53 y.o. female calls for telephone consult today. Patient provides verbal consent for consult held via phone.  Location of patient: in car at work Location of provider: Working remotely from home Others present for call: none  1. Elevated blood pressure Patient reports that she has not been feeling well over the last few weeks.  She reports Left arm cramping, heart fluttering at night and sensation that she cannot breath normally.  Sensations seem worse at night time.  She checked her BP at home and it was elevated 148/89 (she was asymptomatic during BP check).  She does smoke.  She used Chantix earlier this year which did help her reduce her smoking but she never continued refills because it was $35 per fill and she could not afford this.  Denies any shortness of breath, wheezing, hemoptysis.  She uses her Symbicort 1-2 times daily.  She has not needed her albuterol at all since starting the Symbicort.   ROS: Per HPI  Allergies  Allergen Reactions  . Morphine And Related Hives, Shortness Of Breath and Swelling  . Codeine Nausea And Vomiting  . Penicillins Hives   Past Medical History:  Diagnosis Date  . Allergy   . Anxiety   . Arthritis   . COPD (chronic obstructive pulmonary disease) ([place])   . Hyperlipidemia   . Sleep apnea     Current Outpatient Medications:  .  acetaminophen (TYLENOL) 650 MG CR tablet, Take 650 mg by mouth 2 (two) times daily as needed for pain., Disp: , Rfl:  .  albuterol (PROVENTIL HFA;VENTOLIN HFA) 108 (90 Base) MCG/ACT inhaler, Inhale 2 puffs into the lungs every 6 (six) hours as needed., Disp: , Rfl:  .  budesonide-formoterol (SYMBICORT) 80-4.5 MCG/ACT inhaler, Inhale 2 puffs into the lungs 2 (two) times daily., Disp: 1 Inhaler, Rfl: 5 .  Multiple Vitamin (MULTIVITAMIN) tablet, Take 1 tablet by mouth daily., Disp: , Rfl:  .  pantoprazole (PROTONIX) 40 MG  tablet, Take 1 tablet (40 mg total) by mouth daily., Disp: 30 tablet, Rfl: 3  Assessment/ Plan: 53 y.o. female   1. Elevated blood pressure reading Patient to monitor blood pressures with goal of less than 140/90.  We discussed smoking cessation to reduce cardiovascular risk.  I have scheduled an in office evaluation on 06/09/2019 at 11 AM.  Patient aware of date and time.  She will come in fasting and we will perform fasting labs and may need to consider EKG if she has recurrent symptoms.  We discussed the risk that smoking poses on the cardiovascular system including increased risk of stroke and heart attack.  She is interested in stopping and has Chantix at home but barrier is financial constraint.  We will plan to give a Chantix coupon at her visit in 1 week so that she may obtain this medication.  We reviewed the red flag signs and symptoms that would warrant further evaluation emergency department.  She was good understanding.  2. Chronic obstructive pulmonary disease, unspecified COPD type ([place]) Stable. No refills needed on Symbicort.   Start time: 10:15am End time: 10:25am  Total time spent on patient care (including telephone call/ virtual visit): 12 minutes  [place], [place] [phone]

## 2019-06-02 ENCOUNTER — Ambulatory Visit: Payer: BLUE CROSS/BLUE SHIELD | Admitting: Family Medicine

## 2019-06-08 ENCOUNTER — Other Ambulatory Visit: Payer: Self-pay

## 2019-06-09 ENCOUNTER — Encounter: Payer: Self-pay | Admitting: Family Medicine

## 2019-06-09 ENCOUNTER — Other Ambulatory Visit: Payer: Self-pay

## 2019-06-09 ENCOUNTER — Ambulatory Visit: Payer: BLUE CROSS/BLUE SHIELD | Admitting: Family Medicine

## 2019-06-09 VITALS — BP 128/78 | HR 73 | Temp 97.1°F | Ht 62.0 in | Wt 120.0 lb

## 2019-06-09 DIAGNOSIS — R030 Elevated blood-pressure reading, without diagnosis of hypertension: Secondary | ICD-10-CM | POA: Diagnosis not present

## 2019-06-09 DIAGNOSIS — Z720 Tobacco use: Secondary | ICD-10-CM | POA: Diagnosis not present

## 2019-06-09 DIAGNOSIS — E785 Hyperlipidemia, unspecified: Secondary | ICD-10-CM

## 2019-06-09 DIAGNOSIS — Z23 Encounter for immunization: Secondary | ICD-10-CM

## 2019-06-09 DIAGNOSIS — R002 Palpitations: Secondary | ICD-10-CM

## 2019-06-09 MED ORDER — CHANTIX STARTING MONTH PAK 0.5 MG X 11 & 1 MG X 42 PO TABS: ORAL_TABLET | ORAL | 0 refills | Status: DC

## 2019-06-09 MED ORDER — VARENICLINE TARTRATE 1 MG PO TABS: 1.0000 mg | ORAL_TABLET | Freq: Two times a day (BID) | ORAL | 2 refills | Status: DC

## 2019-06-09 NOTE — Patient Instructions (Signed)
You had labs performed today.  You will be contacted with the results of the labs once they are available, usually in the next 3 business days for routine lab work.  If you have an active my chart account, they will be released to your MyChart.  If you prefer to have these labs released to you via telephone, please let us know.  If you had a pap smear or biopsy performed, expect to be contacted in about 7-10 days.   Palpitations Palpitations are feelings that your heartbeat is irregular or is faster than normal. It may feel like your heart is fluttering or skipping a beat. Palpitations are usually not a serious problem. They may be caused by many things, including smoking, caffeine, alcohol, stress, and certain medicines or drugs. Most causes of palpitations are not serious. However, some palpitations can be a sign of a serious problem. You may need further tests to rule out serious medical problems. Follow these instructions at home:     Pay attention to any changes in your condition. Take these actions to help manage your symptoms: Eating and drinking  Avoid foods and drinks that may cause palpitations. These may include: ? Caffeinated coffee, tea, soft drinks, diet pills, and energy drinks. ? Chocolate. ? Alcohol. Lifestyle  Take steps to reduce your stress and anxiety. Things that can help you relax include: ? Yoga. ? Mind-body activities, such as deep breathing, meditation, or using words and images to create positive thoughts (guided imagery). ? Physical activity, such as swimming, jogging, or walking. Tell your health care provider if your palpitations increase with activity. If you have chest pain or shortness of breath with activity, do not continue the activity until you are seen by your health care provider. ? Biofeedback. This is a method that helps you learn to use your mind to control things in your body, such as your heartbeat.  Do not use drugs, including cocaine or ecstasy.  Do not use marijuana.  Get plenty of rest and sleep. Keep a regular bed time. General instructions  Take over-the-counter and prescription medicines only as told by your health care provider.  Do not use any products that contain nicotine or tobacco, such as cigarettes and e-cigarettes. If you need help quitting, ask your health care provider.  Keep all follow-up visits as told by your health care provider. This is important. These may include visits for further testing if palpitations do not go away or get worse. Contact a health care provider if you:  Continue to have a fast or irregular heartbeat after 24 hours.  Notice that your palpitations occur more often. Get help right away if you:  Have chest pain or shortness of breath.  Have a severe headache.  Feel dizzy or you faint. Summary  Palpitations are feelings that your heartbeat is irregular or is faster than normal. It may feel like your heart is fluttering or skipping a beat.  Palpitations may be caused by many things, including smoking, caffeine, alcohol, stress, certain medicines, and drugs.  Although most causes of palpitations are not serious, some causes can be a sign of a serious medical problem.  Get help right away if you faint or have chest pain, shortness of breath, a severe headache, or dizziness. This information is not intended to replace advice given to you by your health care provider. Make sure you discuss any questions you have with your health care provider. Document Released: 08/17/2000 Document Revised: 10/02/2017 Document Reviewed: 10/02/2017 Elsevier Patient  Education  2020 Elsevier Inc.  

## 2019-06-09 NOTE — Progress Notes (Signed)
Subjective: CC: f/u elevated BP reading, heart palpitations PCP: Janora Norlander, DO Holly Macdonald is a 53 y.o. female presenting to clinic today for:  1.  Elevated blood pressure reading Patient was evaluated via tele-visit on 06/01/2019 when she was reporting feeling poorly over the prior few weeks.  She had reported left arm cramping, heart fluttering at night and sensation of not being able to catch her breath during episodes.  Symptoms were always worse at nighttime and she checked her blood pressure and noted to be elevated at 148/89.  She is instructed to continue monitoring blood pressures and record.  She is here for interval checkup.  Since our last visit she notes that she has had ongoing evening symptoms that are unchanged from previous check.  Blood pressures have been running better however with most being 585-277 systolic over 82U.  No history of thyroid disorder.  No known blood loss.  She would like to go ahead and proceed with Chantix refill for smoking cessation.  She denies any anxiety or panic.  Infection feels that she is most calm at night which may be why she feels the palpitations more specifically at nighttime.  ROS: Per HPI  Allergies  Allergen Reactions  . Morphine And Related Hives, Shortness Of Breath and Swelling  . Codeine Nausea And Vomiting  . Penicillins Hives   Past Medical History:  Diagnosis Date  . Allergy   . Anxiety   . Arthritis   . COPD (chronic obstructive pulmonary disease) ([place])   . Hyperlipidemia   . Sleep apnea     Current Outpatient Medications:  .  acetaminophen (TYLENOL) 650 MG CR tablet, Take 650 mg by mouth 2 (two) times daily as needed for pain., Disp: , Rfl:  .  albuterol (PROVENTIL HFA;VENTOLIN HFA) 108 (90 Base) MCG/ACT inhaler, Inhale 2 puffs into the lungs every 6 (six) hours as needed., Disp: , Rfl:  .  budesonide-formoterol (SYMBICORT) 80-4.5 MCG/ACT inhaler, Inhale 2 puffs into the lungs 2 (two) times daily.,  Disp: 1 Inhaler, Rfl: 5 .  Multiple Vitamin (MULTIVITAMIN) tablet, Take 1 tablet by mouth daily., Disp: , Rfl:  .  pantoprazole (PROTONIX) 40 MG tablet, Take 1 tablet (40 mg total) by mouth daily., Disp: 30 tablet, Rfl: 3 Social History   Socioeconomic History  . Marital status: Married    Spouse name: Not on file  . Number of children: Not on file  . Years of education: Not on file  . Highest education level: Not on file  Occupational History  . Not on file  Social Needs  . Financial resource strain: Not on file  . Food insecurity    Worry: Not on file    Inability: Not on file  . Transportation needs    Medical: Not on file    Non-medical: Not on file  Tobacco Use  . Smoking status: Former Smoker    Packs/day: 1.50    Years: 40.00    Pack years: 60.00    Types: Cigarettes    Quit date: 06/02/2018    Years since quitting: 1.0  . Smokeless tobacco: Never Used  Substance and Sexual Activity  . Alcohol use: Yes    Alcohol/week: 1.0 standard drinks    Types: 1 Cans of beer per week  . Drug use: No  . Sexual activity: Never  Lifestyle  . Physical activity    Days per week: Not on file    Minutes per session: Not on file  .  Stress: Not on file  Relationships  . Social Herbalist on phone: Not on file    Gets together: Not on file    Attends religious service: Not on file    Active member of club or organization: Not on file    Attends meetings of clubs or organizations: Not on file    Relationship status: Not on file  . Intimate partner violence    Fear of current or ex partner: Not on file    Emotionally abused: Not on file    Physically abused: Not on file    Forced sexual activity: Not on file  Other Topics Concern  . Not on file  Social History Narrative  . Not on file   Family History  Problem Relation Age of Onset  . Cancer Mother   . COPD Mother   . Hearing loss Father   . Early death Son   . Mental illness Son   . Depression Son   .  Cancer Maternal Aunt   . COPD Paternal Uncle   . Cancer Maternal Grandmother   . COPD Maternal Grandmother   . Varicose Veins Maternal Grandfather   . Arthritis Paternal Grandmother   . COPD Paternal Grandfather   . Hearing loss Paternal Grandfather   . Cancer Maternal Aunt   . Colon cancer Neg Hx   . Esophageal cancer Neg Hx   . Rectal cancer Neg Hx   . Stomach cancer Neg Hx     Objective: Office vital signs reviewed. BP 128/78   Pulse 73   Temp (!) 97.1 F (36.2 C) (Temporal)   Ht _0  (1.575 m)   Wt 120 lb (54.4 kg)   SpO2 96%   BMI 21.95 kg/m   Physical Examination:  General: Awake, alert, well nourished, No acute distress HEENT: Normal; sclera white.  No except thalmus.  No goiter.  No palpable thyroid nodules. Cardio: regular rate and rhythm, S1S2 heard, no murmurs appreciated Pulm: clear to auscultation bilaterally, no wheezes, rhonchi or rales; normal work of breathing on room air Extremities: warm, well perfused, No edema, cyanosis or clubbing; +2 pulses bilaterally Skin: dry; intact; no rashes or lesions; normal temperature Neuro: No tremor noted  Assessment/ Plan: 53 y.o. female   1. Elevated blood pressure reading Blood pressures are within normal limits here today and have been at home since her visit.  For now, no antihypertensives needed  2. Palpitations Uncertain etiology.  Will evaluate for possible metabolic etiology.  EKG today without arrhythmia or evidence of ischemia.  I placed a referral to cardiology for consideration of Holter monitor in this patient. - EKG 12-Lead - CMP14+EGFR - TSH - Magnesium - CBC - Ambulatory referral to Cardiology  3. Hyperlipidemia, unspecified hyperlipidemia type Check fasting lipid panel - Lipid Panel - CMP14+EGFR - TSH  4. Tobacco use In action phase.  Chantix sent - varenicline (CHANTIX STARTING MONTH PAK) 0.5 MG X 11 & 1 MG X 42 tablet; Take 0.5 mg tablet by mouth once daily x3 days, then 0.5 mg tablet  twice daily x4 days, then increase to one 1 mg tablet twice daily.  Dispense: 53 tablet; Refill: 0 - varenicline (CHANTIX CONTINUING MONTH PAK) 1 MG tablet; Take 1 tablet (1 mg total) by mouth 2 (two) times daily.  Dispense: 60 tablet; Refill: 2  5. Need for immunization against influenza Administered during today's visit - Flu Vaccine QUAD 36+ mos IM   No orders of the defined types  were placed in this encounter.  No orders of the defined types were placed in this encounter.    Janora Norlander, DO [place] [phone]

## 2019-06-10 ENCOUNTER — Encounter: Payer: Self-pay | Admitting: Cardiology

## 2019-06-10 ENCOUNTER — Ambulatory Visit (INDEPENDENT_AMBULATORY_CARE_PROVIDER_SITE_OTHER): Payer: BLUE CROSS/BLUE SHIELD | Admitting: Cardiology

## 2019-06-10 VITALS — BP 155/91 | HR 69 | Ht 62.0 in | Wt 122.6 lb

## 2019-06-10 DIAGNOSIS — Z720 Tobacco use: Secondary | ICD-10-CM

## 2019-06-10 DIAGNOSIS — R079 Chest pain, unspecified: Secondary | ICD-10-CM | POA: Diagnosis not present

## 2019-06-10 DIAGNOSIS — R002 Palpitations: Secondary | ICD-10-CM | POA: Diagnosis not present

## 2019-06-10 DIAGNOSIS — Z716 Tobacco abuse counseling: Secondary | ICD-10-CM | POA: Diagnosis not present

## 2019-06-10 DIAGNOSIS — Z7189 Other specified counseling: Secondary | ICD-10-CM

## 2019-06-10 LAB — CBC
Hematocrit: 40.7 % (ref 34.0–46.6)
Hemoglobin: 13.9 g/dL (ref 11.1–15.9)
MCH: 31 pg (ref 26.6–33.0)
MCHC: 34.2 g/dL (ref 31.5–35.7)
MCV: 91 fL (ref 79–97)
Platelets: 272 10*3/uL (ref 150–450)
RBC: 4.49 x10E6/uL (ref 3.77–5.28)
RDW: 13.1 % (ref 11.7–15.4)
WBC: 7.4 10*3/uL (ref 3.4–10.8)

## 2019-06-10 LAB — CMP14+EGFR
ALT: 10 IU/L (ref 0–32)
AST: 15 IU/L (ref 0–40)
Albumin/Globulin Ratio: 1.3 (ref 1.2–2.2)
Albumin: 4 g/dL (ref 3.8–4.9)
Alkaline Phosphatase: 103 IU/L (ref 39–117)
BUN/Creatinine Ratio: 11 (ref 9–23)
BUN: 10 mg/dL (ref 6–24)
Bilirubin Total: 0.3 mg/dL (ref 0.0–1.2)
CO2: 24 mmol/L (ref 20–29)
Calcium: 9.8 mg/dL (ref 8.7–10.2)
Chloride: 104 mmol/L (ref 96–106)
Creatinine, Ser: 0.88 mg/dL (ref 0.57–1.00)
GFR calc Af Amer: 86 mL/min/{1.73_m2} (ref >59)
GFR calc non Af Amer: 74 mL/min/{1.73_m2} (ref >59)
Globulin, Total: 3 g/dL (ref 1.5–4.5)
Glucose: 77 mg/dL (ref 65–99)
Potassium: 4.8 mmol/L (ref 3.5–5.2)
Sodium: 141 mmol/L (ref 134–144)
Total Protein: 7 g/dL (ref 6.0–8.5)

## 2019-06-10 LAB — MAGNESIUM: Magnesium: 2.3 mg/dL (ref 1.6–2.3)

## 2019-06-10 LAB — TSH: TSH: 1.38 u[IU]/mL (ref 0.450–4.500)

## 2019-06-10 LAB — LIPID PANEL
Chol/HDL Ratio: 5.4 ratio — ABNORMAL HIGH (ref 0.0–4.4)
Cholesterol, Total: 247 mg/dL — ABNORMAL HIGH (ref 100–199)
HDL: 46 mg/dL (ref >39)
LDL Chol Calc (NIH): 177 mg/dL — ABNORMAL HIGH (ref 0–99)
Triglycerides: 133 mg/dL (ref 0–149)
VLDL Cholesterol Cal: 24 mg/dL (ref 5–40)

## 2019-06-10 NOTE — Patient Instructions (Signed)
Medication Instructions:  Your Physician recommend you continue on your current medication as directed.    If you need a refill on your cardiac medications before your next appointment, please call your pharmacy.   Lab work: None  Testing/Procedures: Your physician has requested that you have an echocardiogram. Echocardiography is a painless test that uses sound waves to create images of your heart. It provides your doctor with information about the size and shape of your heart and how well your heart's chambers and valves are working. This procedure takes approximately one hour. There are no restrictions for this procedure. [place] [address]  Our physician has recommended that you wear an 7  DAY ZIO-PATCH monitor. The Zio patch cardiac monitor continuously records heart rhythm data for up to 14 days, this is for patients being evaluated for multiple types heart rhythms. For the first 24 hours post application, please avoid getting the Zio monitor wet in the shower or by excessive sweating during exercise. After that, feel free to carry on with regular activities. Keep soaps and lotions away from the ZIO XT Patch.         Follow-Up: At [facility], you and your health needs are our priority.  As part of our continuing mission to provide you with exceptional heart care, we have created designated Provider Care Teams.  These Care Teams include your primary Cardiologist (physician) and Advanced Practice Providers (APPs -  Physician Assistants and Nurse Practitioners) who all work together to provide you with the care you need, when you need it. You will need a follow up appointment in 4 weeks.  Please call our office 2 months in advance to schedule this appointment.  You may see Dr. Harrell Gave or one of the following Advanced Practice Providers on your designated Care Team:   Rosaria Ferries, PA-C . Jory Sims, DNP, ANP

## 2019-06-10 NOTE — Progress Notes (Signed)
Cardiology Office Note:    Date:  06/10/2019   ID:  Holly Macdonald, DOB 08/30/1966, MRN [id number]  PCP:  Janora Norlander, DO  Cardiologist:  Buford Dresser, MD  Referring MD: Janora Norlander, DO   CC: new patient consult for palpitations  History of Present Illness:    Holly Macdonald is a 53 y.o. female with a hx of elevated blood pressure, COPD, anxiety, prior sleep apnea, tobacco use who is seen as a new consult at the request of Janora Norlander, DO for the evaluation and management of palpitations.  Last seen yesterday by her PCP Dr. Lajuana Ripple. Has reported left arm cramping, heart fluttering at night with shortness of breath. Had an elevated BP reading of 148/89. Repeat readings noted as 110-120/70s. Working on tobacco cessation, discussed Chantix with PCP.  Patient additional concerns today: notes that she is not sleeping well. Without OTC sleeping pill, wakes up 2-4 times/night. Usually has to be up at 5:30 every morning for her job. Wonders if this is why she feels fatigue. Has been told she snores. Years ago was told she has sleep apnea, lost weight, was told she no longer needs CPAP. Has had several rechecks since then, told she was fine. Last recheck was 2 years ago.  Tachycardia/palpitations: -Initial onset: started about 2 mos ago, was always at night and now happening during the day.  -Frequency/Duration: every day -Symptoms: feels her heart beating, makes her catch her breath. Feels an occasional hard beat. Feels like it might beat out of chest. Makes her feel short of breath. Also feels chest tightness as well, not better with protonix. Feels like indigestion/squeezing. Happens not daily, comes and goes. Happens both at night and during the day, not exertional. Lasts several minutes to several hours.  -Aggravating/alleviating factors: no clear. -Syncope/near syncope: none -Prior cardiac history: none -Prior ECG: 06/09/19 SR with RSR in V1 -Prior workup:  none -Prior treatment: none -Possible medication interactions: none -Caffeine: no sodas, does drink tea all day long plus one cup of coffee in AM.  -Alcohol: very rare -Tobacco: about to restart chantix as this helped her before (had quit for 2 mos). Wants to quit. Currently smoking 1/2 pd -OTC supplements: none -Comorbidities: HTN, prior OSA -Exercise level: Very busy during the day, no intentional exercise beyond walking dog about 30 minutes. Works in the yard all weekend.  -Labs: TSH, kidney function/electrolytes, CBC reviewed. -Cardiac ROS: positive orthopnea, PND, chest pain, palpitations, DOE. No LE edema or weight gain. No syncope. -Family history: father had MI last Nov/Dec due to high cholesterol. Unclear about the rest of her family. Mother's side had cancers. Teena Irani has CVA, rheumatoid arthritis. Gena Fray died of old age at 55.  Past Medical History:  Diagnosis Date  . Allergy   . Anxiety   . Arthritis   . COPD (chronic obstructive pulmonary disease) ([place])   . Hyperlipidemia   . Sleep apnea     Past Surgical History:  Procedure Laterality Date  . HYSTERECTOMY ABDOMINAL WITH SALPINGECTOMY    . Tardy Ulna Palsy      Current Medications: Current Outpatient Medications on File Prior to Visit  Medication Sig  . acetaminophen (TYLENOL) 650 MG CR tablet Take 650 mg by mouth 2 (two) times daily as needed for pain.  Marland Kitchen albuterol (PROVENTIL HFA;VENTOLIN HFA) 108 (90 Base) MCG/ACT inhaler Inhale 2 puffs into the lungs every 6 (six) hours as needed.  . budesonide-formoterol (SYMBICORT) 80-4.5 MCG/ACT inhaler Inhale 2  puffs into the lungs 2 (two) times daily.  . Multiple Vitamin (MULTIVITAMIN) tablet Take 1 tablet by mouth daily.  . pantoprazole (PROTONIX) 40 MG tablet Take 1 tablet (40 mg total) by mouth daily.  . varenicline (CHANTIX CONTINUING MONTH PAK) 1 MG tablet Take 1 tablet (1 mg total) by mouth 2 (two) times daily.  . varenicline (CHANTIX STARTING MONTH PAK) 0.5 MG X 11 &  1 MG X 42 tablet Take 0.5 mg tablet by mouth once daily x3 days, then 0.5 mg tablet twice daily x4 days, then increase to one 1 mg tablet twice daily.   No current facility-administered medications on file prior to visit.      Allergies:   Morphine and related, Codeine, and Penicillins   Social History   Tobacco Use  . Smoking status: Current Every Day Smoker    Packs/day: 0.50    Years: 40.00    Pack years: 20.00    Types: Cigarettes  . Smokeless tobacco: Never Used  Substance Use Topics  . Alcohol use: Yes    Alcohol/week: 1.0 standard drinks    Types: 1 Cans of beer per week  . Drug use: No    Family History: family history includes Arthritis in her paternal grandmother; COPD in her maternal grandmother, mother, paternal grandfather, and paternal uncle; Cancer in her maternal aunt, maternal aunt, maternal grandmother, and mother; Depression in her son; Early death in her son; Hearing loss in her father and paternal grandfather; Mental illness in her son; Varicose Veins in her maternal grandfather. There is no history of Colon cancer, Esophageal cancer, Rectal cancer, or Stomach cancer.  father had MI last Nov/Dec due to high cholesterol. Unclear about the rest of her family. Mother's side had cancers. Teena Irani has CVA, rheumatoid arthritis. Gena Fray died of old age at 41.  ROS:   Please see the history of present illness.  Additional pertinent ROS: Constitutional: Negative for chills, fever, night sweats, unintentional weight loss  HENT: Negative for ear pain and hearing loss.   Eyes: Negative for loss of vision and eye pain.  Respiratory: Negative for cough, sputum, wheezing.   Cardiovascular: See HPI. Gastrointestinal: Negative for abdominal pain, melena, and hematochezia.  Genitourinary: Negative for dysuria and hematuria.  Musculoskeletal: Negative for falls and myalgias.  Skin: Negative for itching and rash.  Neurological: Negative for focal weakness, focal sensory changes  and loss of consciousness.  Endo/Heme/Allergies: Does not bruise/bleed easily.     EKGs/Labs/Other Studies Reviewed:    The following studies were reviewed today: No prior cardiac studies  EKG:  EKG is personally reviewed.  The ekg ordered today demonstrates sinus rhythm with sinus arrhythmia, iRBBB  Recent Labs: 06/09/2019: ALT 10; BUN 10; Creatinine, Ser 0.88; Hemoglobin 13.9; Magnesium 2.3; Platelets 272; Potassium 4.8; Sodium 141; TSH 1.380  Recent Lipid Panel    Component Value Date/Time   CHOL 247 (H) 06/09/2019 1142   TRIG 133 06/09/2019 1142   HDL 46 06/09/2019 1142   CHOLHDL 5.4 (H) 06/09/2019 1142   LDLCALC 177 (H) 06/09/2019 1142    Physical Exam:    VS:  BP (!) 155/91   Pulse 69   Ht 5\' 2"  (1.575 m)   Wt 122 lb 9.6 oz (55.6 kg)   SpO2 99%   BMI 22.42 kg/m     Wt Readings from Last 3 Encounters:  06/10/19 122 lb 9.6 oz (55.6 kg)  06/09/19 120 lb (54.4 kg)  08/04/18 125 lb 3.2 oz (56.8 kg)  GEN: Well nourished, well developed in no acute distress HEENT: Normal, moist mucous membranes NECK: No JVD CARDIAC: regular rhythm, normal S1 and S2, no murmurs, rubs, gallops.  VASCULAR: Radial and DP pulses 2+ bilaterally. No carotid bruits RESPIRATORY:  Clear to auscultation without rales, wheezing or rhonchi  ABDOMEN: Soft, non-tender, non-distended MUSCULOSKELETAL:  Ambulates independently SKIN: Warm and dry, no edema NEUROLOGIC:  Alert and oriented x 3. No focal neuro deficits noted. PSYCHIATRIC:  Normal affect    ASSESSMENT:    1. Palpitation   2. Chest pain, unspecified type   3. Tobacco abuse counseling   4. Tobacco use   5. Cardiac risk counseling   6. Counseling on health promotion and disease prevention    PLAN:    Palpitations: unclear etiology -evaluate with echo, monitor  Tobacco use counseling: The patient was counseled on tobacco cessation today for 4 minutes.  Counseling included reviewing the risks of smoking tobacco products, how it  impacts the patient's current medical diagnoses and different strategies for quitting.  Pharmacotherapy to aid in tobacco cessation was not prescribed today.  Cardiac risk counseling and prevention recommendations: -recommend heart healthy/Mediterranean diet, with whole grains, fruits, vegetable, fish, lean meats, nuts, and olive oil. Limit salt. -recommend moderate walking, 3-5 times/week for 30-50 minutes each session. Aim for at least 150 minutes.week. Goal should be pace of 3 miles/hours, or walking 1.5 miles in 30 minutes -recommend avoidance of tobacco products. Avoid excess alcohol. -ASCVD risk score: The 10-year ASCVD risk score Mikey Bussing DC Brooke Bonito., et al., 2013) is: 7.1%   Values used to calculate the score:     Age: 38 years     Sex: Female     Is Non-Hispanic African American: No     Diabetic: No     Tobacco smoker: Yes     Systolic Blood Pressure: 0000000 mmHg     Is BP treated: No     HDL Cholesterol: 46 mg/dL     Total Cholesterol: 247 mg/dL    Plan for follow up: 4 weeks  Medication Adjustments/Labs and Tests Ordered: Current medicines are reviewed at length with the patient today.  Concerns regarding medicines are outlined above.  Orders Placed This Encounter  Procedures  . LONG TERM MONITOR (3-14 DAYS)  . EKG 12-Lead  . ECHOCARDIOGRAM COMPLETE   No orders of the defined types were placed in this encounter.   Patient Instructions  Medication Instructions:  Your Physician recommend you continue on your current medication as directed.    If you need a refill on your cardiac medications before your next appointment, please call your pharmacy.   Lab work: None  Testing/Procedures: Your physician has requested that you have an echocardiogram. Echocardiography is a painless test that uses sound waves to create images of your heart. It provides your doctor with information about the size and shape of your heart and how well your heart's chambers and valves are working. This  procedure takes approximately one hour. There are no restrictions for this procedure. [place] 300  Our physician has recommended that you wear an 7  DAY ZIO-PATCH monitor. The Zio patch cardiac monitor continuously records heart rhythm data for up to 14 days, this is for patients being evaluated for multiple types heart rhythms. For the first 24 hours post application, please avoid getting the Zio monitor wet in the shower or by excessive sweating during exercise. After that, feel free to carry on with regular activities. Keep soaps and lotions  away from the ZIO XT Patch.         Follow-Up: At [facility], you and your health needs are our priority.  As part of our continuing mission to provide you with exceptional heart care, we have created designated Provider Care Teams.  These Care Teams include your primary Cardiologist (physician) and Advanced Practice Providers (APPs -  Physician Assistants and Nurse Practitioners) who all work together to provide you with the care you need, when you need it. You will need a follow up appointment in 4 weeks.  Please call our office 2 months in advance to schedule this appointment.  You may see Dr. Harrell Gave or one of the following Advanced Practice Providers on your designated Care Team:   Rosaria Ferries, PA-C . Jory Sims, DNP, ANP       Signed, Buford Dresser, MD PhD 06/10/2019 2:21 PM    [place]

## 2019-06-17 ENCOUNTER — Telehealth: Payer: Self-pay

## 2019-06-17 NOTE — Telephone Encounter (Signed)
Spoke to pt, gave her detailed monitor instructions. Verified address. Ordered 7 day Zio monitor to be mailed to pt's home address.

## 2019-06-22 ENCOUNTER — Ambulatory Visit (INDEPENDENT_AMBULATORY_CARE_PROVIDER_SITE_OTHER): Payer: BLUE CROSS/BLUE SHIELD

## 2019-06-22 DIAGNOSIS — R002 Palpitations: Secondary | ICD-10-CM

## 2019-06-22 DIAGNOSIS — R079 Chest pain, unspecified: Secondary | ICD-10-CM | POA: Diagnosis not present

## 2019-06-23 ENCOUNTER — Other Ambulatory Visit (HOSPITAL_COMMUNITY): Payer: BLUE CROSS/BLUE SHIELD

## 2019-06-25 ENCOUNTER — Encounter: Payer: Self-pay | Admitting: Cardiology

## 2019-06-29 ENCOUNTER — Ambulatory Visit (HOSPITAL_COMMUNITY): Payer: BLUE CROSS/BLUE SHIELD | Attending: Cardiology

## 2019-06-29 ENCOUNTER — Other Ambulatory Visit: Payer: Self-pay

## 2019-06-29 DIAGNOSIS — R079 Chest pain, unspecified: Secondary | ICD-10-CM

## 2019-06-29 DIAGNOSIS — R002 Palpitations: Secondary | ICD-10-CM

## 2019-07-07 ENCOUNTER — Ambulatory Visit: Payer: BLUE CROSS/BLUE SHIELD | Admitting: Family Medicine

## 2019-07-07 DIAGNOSIS — R002 Palpitations: Secondary | ICD-10-CM | POA: Diagnosis not present

## 2019-07-21 ENCOUNTER — Other Ambulatory Visit: Payer: Self-pay

## 2019-07-21 ENCOUNTER — Ambulatory Visit: Payer: BLUE CROSS/BLUE SHIELD | Admitting: Cardiology

## 2019-07-21 VITALS — BP 140/77 | HR 66 | Ht 62.0 in | Wt 124.0 lb

## 2019-07-21 DIAGNOSIS — Z7189 Other specified counseling: Secondary | ICD-10-CM | POA: Diagnosis not present

## 2019-07-21 DIAGNOSIS — Z716 Tobacco abuse counseling: Secondary | ICD-10-CM | POA: Diagnosis not present

## 2019-07-21 DIAGNOSIS — Z712 Person consulting for explanation of examination or test findings: Secondary | ICD-10-CM | POA: Diagnosis not present

## 2019-07-21 DIAGNOSIS — R002 Palpitations: Secondary | ICD-10-CM | POA: Diagnosis not present

## 2019-07-21 DIAGNOSIS — R030 Elevated blood-pressure reading, without diagnosis of hypertension: Secondary | ICD-10-CM

## 2019-07-21 NOTE — Progress Notes (Signed)
Cardiology Office Note:    Date:  07/21/2019   ID:  Holly Macdonald, DOB 1966/05/02, MRN [id number]  PCP:  Janora Norlander, DO  Cardiologist:  Buford Dresser, MD  Referring MD: Janora Norlander, DO   CC: follow up  History of Present Illness:    Holly Macdonald is a 53 y.o. female with a hx of elevated blood pressure, COPD, anxiety, prior sleep apnea, tobacco use who is seen for follow up. I initially saw her 06/10/19 as a new consult at the request of Janora Norlander, DO for the evaluation and management of palpitations.  Today: Reviewed monitor and echo results together, see below. Triggered events were normal sinus rhythm, occasionally with PVC. We reviewed the one episode that was somewhat unclear. It was asymptomatic, labeled as NSVT on the monitor. However, is was slow (110 bpm), only 14 beats, and occurred overnight. May have been a junctional tachycardia. No structural abnormalities seen on echo, EF low normal.  Palpitations infrequent, manageable.  Tobacco use: Taking Chantix, less than 1/2 ppd from 1 ppd. Encouraged her to continue with this, discussed long term benefits to cessation.  HTN: has been up and down. Discussed options for treatment and meds. She is amenable to quitting smoking. Discussed both verapamil and HCTZ as options for management for long term (verapamil given resting normotension and elevation with activity).  Denies chest pain, shortness of breath at rest or with normal exertion. No PND, orthopnea, LE edema or unexpected weight gain. No syncope.  Past Medical History:  Diagnosis Date   Allergy    Anxiety    Arthritis    COPD (chronic obstructive pulmonary disease) ([place])    Hyperlipidemia    Sleep apnea     Past Surgical History:  Procedure Laterality Date   HYSTERECTOMY ABDOMINAL WITH SALPINGECTOMY     Tardy Ulna Palsy      Current Medications: Current Outpatient Medications on File Prior to Visit  Medication Sig    acetaminophen (TYLENOL) 650 MG CR tablet Take 650 mg by mouth 2 (two) times daily as needed for pain.   albuterol (PROVENTIL HFA;VENTOLIN HFA) 108 (90 Base) MCG/ACT inhaler Inhale 2 puffs into the lungs every 6 (six) hours as needed.   budesonide-formoterol (SYMBICORT) 80-4.5 MCG/ACT inhaler Inhale 2 puffs into the lungs 2 (two) times daily.   Multiple Vitamin (MULTIVITAMIN) tablet Take 1 tablet by mouth daily.   pantoprazole (PROTONIX) 40 MG tablet Take 1 tablet (40 mg total) by mouth daily.   varenicline (CHANTIX CONTINUING MONTH PAK) 1 MG tablet Take 1 tablet (1 mg total) by mouth 2 (two) times daily.   varenicline (CHANTIX STARTING MONTH PAK) 0.5 MG X 11 & 1 MG X 42 tablet Take 0.5 mg tablet by mouth once daily x3 days, then 0.5 mg tablet twice daily x4 days, then increase to one 1 mg tablet twice daily.   No current facility-administered medications on file prior to visit.      Allergies:   Morphine and related, Codeine, and Penicillins   Social History   Tobacco Use   Smoking status: Current Every Day Smoker    Packs/day: 0.50    Years: 40.00    Pack years: 20.00    Types: Cigarettes   Smokeless tobacco: Never Used  Substance Use Topics   Alcohol use: Yes    Alcohol/week: 1.0 standard drinks    Types: 1 Cans of beer per week   Drug use: No    Family History: family history  includes Arthritis in her paternal grandmother; COPD in her maternal grandmother, mother, paternal grandfather, and paternal uncle; Cancer in her maternal aunt, maternal aunt, maternal grandmother, and mother; Depression in her son; Early death in her son; Hearing loss in her father and paternal grandfather; Mental illness in her son; Varicose Veins in her maternal grandfather. There is no history of Colon cancer, Esophageal cancer, Rectal cancer, or Stomach cancer.  father had MI last Nov/Dec due to high cholesterol. Unclear about the rest of her family. Mother's side had cancers. Teena Irani has CVA,  rheumatoid arthritis. Gena Fray died of old age at 25.  ROS:   Please see the history of present illness.  Additional pertinent ROS: Constitutional: Negative for chills, fever, night sweats, unintentional weight loss  HENT: Negative for ear pain and hearing loss.   Eyes: Negative for loss of vision and eye pain.  Respiratory: Negative for cough, sputum, wheezing.   Cardiovascular: See HPI. Gastrointestinal: Negative for abdominal pain, melena, and hematochezia.  Genitourinary: Negative for dysuria and hematuria.  Musculoskeletal: Negative for falls and myalgias.  Skin: Negative for itching and rash.  Neurological: Negative for focal weakness, focal sensory changes and loss of consciousness.  Endo/Heme/Allergies: Does not bruise/bleed easily.   EKGs/Labs/Other Studies Reviewed:    The following studies were reviewed today: Echo 06/29/19 1. Left ventricular ejection fraction, by visual estimation, is 50 to 55%. The left ventricle has normal function. Normal left ventricular size. There is no left ventricular hypertrophy.  2. Global right ventricle has normal systolic function.The right ventricular size is normal. No increase in right ventricular wall thickness.  3. Left atrial size was normal.  4. Right atrial size was normal.  5. The mitral valve is normal in structure. Trace mitral valve regurgitation. No evidence of mitral stenosis.  6. The tricuspid valve is normal in structure. Tricuspid valve regurgitation is trivial.  7. The aortic valve is tricuspid Aortic valve regurgitation was not visualized by color flow Doppler. Mild aortic valve sclerosis without stenosis.  8. The pulmonic valve was normal in structure. Pulmonic valve regurgitation is not visualized by color flow Doppler.  9. Mildly elevated pulmonary artery systolic pressure. 10. The inferior vena cava is normal in size with greater than 50% respiratory variability, suggesting right atrial pressure of 3 mmHg. 11. Low normal  LV systolic function; normal diastolic function;Marland Kitchen  Monitor 07/2019 6 days of data recorded on Zio monitor. Patient had a min HR of 48 bpm, max HR of 156 bpm, and avg HR of 78 bpm. Predominant underlying rhythm was Sinus Rhythm. No VT, SVT, atrial fibrillation, high degree block, or pauses noted. Isolated atrial and ventricular ectopy was rare (<1%). There were 7 triggered events. These were normal sinus rhythm, some with occasional PVC. There was one run of 14 beats of non-sinus rhythm. It does not appear wide complex, though it was labeled as NSVT. Rate was 110 bpm, occurred overnight. May be junctional tachycardia. No other significant arrhythmias detected.   EKG:  EKG is personally reviewed.  The ekg ordered 06/10/19 (labeled as 06/08/19 on the image) demonstrates sinus rhythm with sinus arrhythmia, iRBBB  Recent Labs: 06/09/2019: ALT 10; BUN 10; Creatinine, Ser 0.88; Hemoglobin 13.9; Magnesium 2.3; Platelets 272; Potassium 4.8; Sodium 141; TSH 1.380  Recent Lipid Panel    Component Value Date/Time   CHOL 247 (H) 06/09/2019 1142   TRIG 133 06/09/2019 1142   HDL 46 06/09/2019 1142   CHOLHDL 5.4 (H) 06/09/2019 1142   LDLCALC 177 (H)  06/09/2019 1142    Physical Exam:    VS:  BP 140/77    Pulse 66    Ht 5\' 2"  (1.575 m)    Wt 124 lb (56.2 kg)    SpO2 100%    BMI 22.68 kg/m     Wt Readings from Last 3 Encounters:  07/21/19 124 lb (56.2 kg)  06/10/19 122 lb 9.6 oz (55.6 kg)  06/09/19 120 lb (54.4 kg)    GEN: Well nourished, well developed in no acute distress HEENT: Normal, moist mucous membranes NECK: No JVD CARDIAC: regular rhythm, normal S1 and S2, no rubs or gallops. No murmur. VASCULAR: Radial and DP pulses 2+ bilaterally. No carotid bruits RESPIRATORY:  Clear to auscultation without rales, wheezing or rhonchi  ABDOMEN: Soft, non-tender, non-distended MUSCULOSKELETAL:  Ambulates independently SKIN: Warm and dry, no edema NEUROLOGIC:  Alert and oriented x 3. No focal neuro  deficits noted. PSYCHIATRIC:  Normal affect   ASSESSMENT:    1. Encounter to discuss test results   2. Tobacco abuse counseling   3. Palpitation   4. Cardiac risk counseling   5. Counseling on health promotion and disease prevention   6. Elevated blood pressure reading    PLAN:    Palpitations: monitor was sinus rhythm with rare PVC. Echo without structural abnormality, EF low normal. One asymptomatic event during sleep looks like junctional tachycardia -infrequent, manageable -discussed red flag signs such as syncope that need immediate medical attention  Elevated blood pressure reading: somewhat elevated today, but this has not been documented consistently.  -wants to work on tobacco cessation -if still elevated, would consider verapamil (as she endorses elevation with activity) or thiazide  Tobacco use counseling: The patient was counseled on tobacco cessation today for 4 minutes.  Counseling included reviewing the risks of smoking tobacco products, how it impacts the patient's current medical diagnoses and different strategies for quitting.  Pharmacotherapy to aid in tobacco cessation was not prescribed today as she is already on Chantix  Cardiac risk counseling and prevention recommendations: -recommend heart healthy/Mediterranean diet, with whole grains, fruits, vegetable, fish, lean meats, nuts, and olive oil. Limit salt. -recommend moderate walking, 3-5 times/week for 30-50 minutes each session. Aim for at least 150 minutes.week. Goal should be pace of 3 miles/hours, or walking 1.5 miles in 30 minutes -recommend avoidance of tobacco products. Avoid excess alcohol. -ASCVD risk score: The 10-year ASCVD risk score Mikey Bussing DC Brooke Bonito., et al., 2013) is: 8.4%   Values used to calculate the score:     Age: 69 years     Sex: Female     Is Non-Hispanic African American: No     Diabetic: No     Tobacco smoker: Yes     Systolic Blood Pressure: [id number] mmHg     Is BP treated: No     HDL  Cholesterol: 46 mg/dL     Total Cholesterol: 247 mg/dL    Plan for follow up: 1 year or sooner PRN  TIME SPENT WITH PATIENT: 26 minutes of direct patient care. More than 50% of that time was spent on coordination of care and counseling regarding review of test results, including sharing of report/images with patient and explanation during the visit.  Buford Dresser, MD, PhD [place]   [facility]   Medication Adjustments/Labs and Tests Ordered: Current medicines are reviewed at length with the patient today.  Concerns regarding medicines are outlined above.  No orders of the defined types were placed in this encounter.  No  orders of the defined types were placed in this encounter.   Patient Instructions  Medication Instructions:  Your Physician recommend you continue on your current medication as directed.    *If you need a refill on your cardiac medications before your next appointment, please call your pharmacy*  Lab Work: None  Testing/Procedures: None  Follow-Up: At [facility], you and your health needs are our priority.  As part of our continuing mission to provide you with exceptional heart care, we have created designated Provider Care Teams.  These Care Teams include your primary Cardiologist (physician) and Advanced Practice Providers (APPs -  Physician Assistants and Nurse Practitioners) who all work together to provide you with the care you need, when you need it.  Your next appointment:   1 year(s)  The format for your next appointment:   In Person  Provider:   Buford Dresser, MD      Signed, Buford Dresser, MD PhD 07/21/2019  [place]

## 2019-07-21 NOTE — Patient Instructions (Signed)

## 2019-07-26 ENCOUNTER — Encounter: Payer: Self-pay | Admitting: Cardiology

## 2019-08-03 ENCOUNTER — Encounter: Payer: Self-pay | Admitting: Family Medicine

## 2019-08-03 DIAGNOSIS — Z20828 Contact with and (suspected) exposure to other viral communicable diseases: Secondary | ICD-10-CM | POA: Diagnosis not present

## 2019-08-04 ENCOUNTER — Telehealth: Payer: BLUE CROSS/BLUE SHIELD | Admitting: Physician Assistant

## 2019-08-04 ENCOUNTER — Telehealth: Payer: Self-pay

## 2019-08-04 ENCOUNTER — Encounter (INDEPENDENT_AMBULATORY_CARE_PROVIDER_SITE_OTHER): Payer: Self-pay

## 2019-08-04 DIAGNOSIS — R05 Cough: Secondary | ICD-10-CM | POA: Diagnosis not present

## 2019-08-04 DIAGNOSIS — R6889 Other general symptoms and signs: Secondary | ICD-10-CM | POA: Diagnosis not present

## 2019-08-04 DIAGNOSIS — R059 Cough, unspecified: Secondary | ICD-10-CM

## 2019-08-04 MED ORDER — PROMETHAZINE-DM 6.25-15 MG/5ML PO SYRP: 5.0000 mL | ORAL_SOLUTION | Freq: Four times a day (QID) | ORAL | 0 refills | Status: DC | PRN

## 2019-08-04 MED ORDER — BENZONATATE 100 MG PO CAPS: 100.0000 mg | ORAL_CAPSULE | Freq: Three times a day (TID) | ORAL | 0 refills | Status: DC | PRN

## 2019-08-04 NOTE — Progress Notes (Signed)
E-Visit for Corona Virus Screening   Your current symptoms could be consistent with the coronavirus.  Many health care providers can now test patients at their office but not all are.  [facility] has multiple testing sites - you do not need an appointment or a lab order. For information on our COVID testing locations and hours go to [email or web address]  Please quarantine yourself while awaiting your test results.    We are enrolling you in our [place] for [place] . Daily you will receive a questionnaire within the [place] website. Our COVID 19 response team willl be monitoriing your responses daily. Please continue good preventive care measures, including:  frequent hand-washing, avoid touching your face, cover coughs/sneezes, stay out of crowds and keep a 6 foot distance from others.    COVID-19 is a respiratory illness with symptoms that are similar to the flu. Symptoms are typically mild to moderate, but there have been cases of severe illness and death due to the virus. The following symptoms may appear 2-14 days after exposure: . Fever . Cough . Shortness of breath or difficulty breathing . Chills . Repeated shaking with chills . Muscle pain . Headache . Sore throat . New loss of taste or smell . Fatigue . Congestion or runny nose . Nausea or vomiting . Diarrhea  If you develop fever/cough/breathlessness, please stay home for 10 days with improving symptoms and until you have had 24 hours of no fever (without taking a fever reducer).  Go to the nearest hospital ED for assessment if fever/cough/breathlessness are severe or illness seems like a threat to life.  It is vitally important that if you feel that you have an infection such as this virus or any other virus that you stay home and away from places where you may spread it to others.  You should avoid contact with people age 13 and older.   You should wear a mask or cloth face covering  over your nose and mouth if you must be around other people or animals, including pets (even at home). Try to stay at least 6 feet away from other people. This will protect the people around you.  You can use medication such as A prescription cough medication called Tessalon Perles 100 mg. You may take 1-2 capsules every 8 hours as needed for cough and A prescription cough medication called Phenergan DM 6.25 mg/15 mg. You make take one teaspoon / 5 ml every 4-6 hours as needed for cough  You may also take acetaminophen (Tylenol) as needed for fever.   Reduce your risk of any infection by using the same precautions used for avoiding the common cold or flu:  Marland Kitchen Wash your hands often with soap and warm water for at least 20 seconds.  If soap and water are not readily available, use an alcohol-based hand sanitizer with at least 60% alcohol.  . If coughing or sneezing, cover your mouth and nose by coughing or sneezing into the elbow areas of your shirt or coat, into a tissue or into your sleeve (not your hands). . Avoid shaking hands with others and consider head nods or verbal greetings only. . Avoid touching your eyes, nose, or mouth with unwashed hands.  . Avoid close contact with people who are sick. . Avoid places or events with large numbers of people in one location, like concerts or sporting events. . Carefully consider travel plans you have or are making. . If you are planning any travel outside or  inside the [place], visit the CDC's Travelers' Health webpage for the latest health notices. . If you have some symptoms but not all symptoms, continue to monitor at home and seek medical attention if your symptoms worsen. . If you are having a medical emergency, call 911.  HOME CARE . Only take medications as instructed by your medical team. . Drink plenty of fluids and get plenty of rest. . A steam or ultrasonic humidifier can help if you have congestion.   GET HELP RIGHT AWAY IF YOU HAVE EMERGENCY  WARNING SIGNS** FOR COVID-19. If you or someone is showing any of these signs seek emergency medical care immediately. Call 911 or proceed to your closest emergency facility if: . You develop worsening high fever. . Trouble breathing . Bluish lips or face . Persistent pain or pressure in the chest . New confusion . Inability to wake or stay awake . You cough up blood. . Your symptoms become more severe  **This list is not all possible symptoms. Contact your medical provider for any symptoms that are sever or concerning to you.   MAKE SURE YOU   Understand these instructions.  Will watch your condition.  Will get help right away if you are not doing well or get worse.  Your e-visit answers were reviewed by a board certified advanced clinical practitioner to complete your personal care plan.  Depending on the condition, your plan could have included both over the counter or prescription medications.  If there is a problem please reply once you have received a response from your provider.  Your safety is important to [place].  If you have drug allergies check your prescription carefully.    You can use MyChart to ask questions about today's visit, request a non-urgent call back, or ask for a work or school excuse for 24 hours related to this e-Visit. If it has been greater than 24 hours you will need to follow up with your provider, or enter a new e-Visit to address those concerns. You will get an e-mail in the next two days asking about your experience.  I hope that your e-visit has been valuable and will speed your recovery. Thank you for using e-visits.   Greater than 5 minutes, yet less than 10 minutes of time have been spent researching, coordinating and implementing care for this patient today.

## 2019-08-04 NOTE — Telephone Encounter (Signed)
Patient advised on diarrhea and change appetite per protocol:   If appetite becomes worse: encourage patient to drink fluids as tolerated, work their way up to bland solid food such as crackers, pretzels, soup, bread or applesauce and boiled starches.   If patient is unable to tolerate any foods or liquids, notify PCP.   IF PATIENT DEVELOPS SEVERE VOMITING (MORE THAN 6 TIMES A DAY AND OR >8 HOURS) AND/OR SEVERE ABDOMINAL PAIN ADVISE PATIENT TO CALL 911 AND SEEK TREATMENT IN ED    If diarrhea remains the same: encourage patient to drink oral fluids and bland foods.   Avoid alcohol, spicy foods, caffeine or fatty foods that could make diarrhea worse.   Continue to monitor for signs of dehydration (increased thirst decreased urine output, yellow urine, dry skin, headache or dizziness).   Advise patient to try OTC medication (Imodium, kaopectate, Pepto-Bismol) as per manufacturer's instructions.   If worsening diarrhea occurs and becomes severe (6-7 bowel movements a day): notify PCP   If diarrhea last greater than 7 days: notify PCP   IF SIGNS OF DEHYDRATION OCCUR (INCREASED THIRST, DECREASED URINE OUTPUT, YELLOW URINE, DRY SKIN, HEADACHE OR DIZZINESS) ADVISE PATIENT TO CALL 911 AND SEEK TREATMENT IN THE ED  Patient states that she has had diarrhea and that she is able to eat but just keeps having to go to bathroom. Patient verbalized understanding

## 2019-08-05 ENCOUNTER — Telehealth: Payer: Self-pay | Admitting: Family Medicine

## 2019-08-05 ENCOUNTER — Encounter (INDEPENDENT_AMBULATORY_CARE_PROVIDER_SITE_OTHER): Payer: Self-pay

## 2019-08-05 NOTE — Telephone Encounter (Signed)
Probably would wait until cough resolved just in case.

## 2019-08-05 NOTE — Telephone Encounter (Signed)
Patient just stopped fever yesterday and still has cough. When do you recommend her to go back?

## 2019-08-05 NOTE — Telephone Encounter (Signed)
Ok to provide if asymptomatic

## 2019-08-05 NOTE — Telephone Encounter (Signed)
Patient aware to call back once cough resides and we will give her a note to co back

## 2019-08-06 ENCOUNTER — Encounter (INDEPENDENT_AMBULATORY_CARE_PROVIDER_SITE_OTHER): Payer: Self-pay

## 2019-08-06 ENCOUNTER — Encounter: Payer: Self-pay | Admitting: Family Medicine

## 2019-08-06 ENCOUNTER — Telehealth: Payer: Self-pay | Admitting: Family Medicine

## 2019-08-06 NOTE — Telephone Encounter (Signed)
Letter faxed back to her work to go back on 08/10/2019

## 2019-08-07 ENCOUNTER — Telehealth: Payer: Self-pay

## 2019-08-07 ENCOUNTER — Encounter (INDEPENDENT_AMBULATORY_CARE_PROVIDER_SITE_OTHER): Payer: Self-pay

## 2019-08-07 NOTE — Telephone Encounter (Signed)
Patient advised on sob per protocol:   Shortness of breath is the same: continue to monitor at home   If symptoms become severe, i.e. shortness of breath at rest, gasping for air, wheezing, CALL [facility] ED  Patient states that she can't breathe because he nose is stopped up.   Patient will continue to monitor symptoms and follow guidelines

## 2019-08-08 ENCOUNTER — Encounter (INDEPENDENT_AMBULATORY_CARE_PROVIDER_SITE_OTHER): Payer: Self-pay

## 2019-08-09 ENCOUNTER — Encounter (INDEPENDENT_AMBULATORY_CARE_PROVIDER_SITE_OTHER): Payer: Self-pay

## 2019-08-10 ENCOUNTER — Encounter (INDEPENDENT_AMBULATORY_CARE_PROVIDER_SITE_OTHER): Payer: Self-pay

## 2019-08-11 ENCOUNTER — Encounter (INDEPENDENT_AMBULATORY_CARE_PROVIDER_SITE_OTHER): Payer: Self-pay

## 2019-08-12 ENCOUNTER — Encounter (INDEPENDENT_AMBULATORY_CARE_PROVIDER_SITE_OTHER): Payer: Self-pay

## 2019-08-13 ENCOUNTER — Encounter (INDEPENDENT_AMBULATORY_CARE_PROVIDER_SITE_OTHER): Payer: Self-pay

## 2019-08-14 ENCOUNTER — Encounter (INDEPENDENT_AMBULATORY_CARE_PROVIDER_SITE_OTHER): Payer: Self-pay

## 2019-08-15 ENCOUNTER — Encounter (INDEPENDENT_AMBULATORY_CARE_PROVIDER_SITE_OTHER): Payer: Self-pay

## 2019-08-16 ENCOUNTER — Encounter (INDEPENDENT_AMBULATORY_CARE_PROVIDER_SITE_OTHER): Payer: Self-pay

## 2019-08-17 ENCOUNTER — Encounter (INDEPENDENT_AMBULATORY_CARE_PROVIDER_SITE_OTHER): Payer: Self-pay

## 2020-01-12 ENCOUNTER — Other Ambulatory Visit: Payer: Self-pay | Admitting: Family Medicine

## 2020-01-12 DIAGNOSIS — Z1231 Encounter for screening mammogram for malignant neoplasm of breast: Secondary | ICD-10-CM

## 2020-06-24 ENCOUNTER — Other Ambulatory Visit: Payer: Self-pay | Admitting: Family Medicine

## 2020-06-24 DIAGNOSIS — Z1231 Encounter for screening mammogram for malignant neoplasm of breast: Secondary | ICD-10-CM

## 2020-06-29 ENCOUNTER — Ambulatory Visit
Admission: RE | Admit: 2020-06-29 | Discharge: 2020-06-29 | Disposition: A | Discharge status: Home / Self Care | Payer: BLUE CROSS/BLUE SHIELD | Source: Ambulatory Visit

## 2020-06-29 ENCOUNTER — Other Ambulatory Visit: Payer: Self-pay

## 2020-06-29 DIAGNOSIS — Z1231 Encounter for screening mammogram for malignant neoplasm of breast: Secondary | ICD-10-CM

## 2020-07-12 ENCOUNTER — Telehealth: Payer: Self-pay | Admitting: *Deleted

## 2020-07-12 NOTE — Telephone Encounter (Signed)
A message was left, re: her follow up visit. 

## 2020-07-22 ENCOUNTER — Encounter: Payer: Self-pay | Admitting: Cardiology

## 2020-07-25 ENCOUNTER — Ambulatory Visit: Payer: BLUE CROSS/BLUE SHIELD | Admitting: Cardiology

## 2020-09-07 ENCOUNTER — Telehealth: Payer: BLUE CROSS/BLUE SHIELD | Admitting: Nurse Practitioner

## 2020-09-07 DIAGNOSIS — J0100 Acute maxillary sinusitis, unspecified: Secondary | ICD-10-CM

## 2020-09-07 MED ORDER — DOXYCYCLINE HYCLATE 100 MG PO TABS: 100.0000 mg | ORAL_TABLET | Freq: Two times a day (BID) | ORAL | 0 refills | Status: DC

## 2020-09-07 NOTE — Progress Notes (Signed)

## 2020-12-12 ENCOUNTER — Telehealth: Payer: Self-pay

## 2020-12-12 DIAGNOSIS — J449 Chronic obstructive pulmonary disease, unspecified: Secondary | ICD-10-CM

## 2020-12-12 MED ORDER — BUDESONIDE-FORMOTEROL FUMARATE 80-4.5 MCG/ACT IN AERO: 2.0000 | INHALATION_SPRAY | Freq: Two times a day (BID) | RESPIRATORY_TRACT | 5 refills | Status: DC

## 2020-12-12 MED ORDER — ALBUTEROL SULFATE HFA 108 (90 BASE) MCG/ACT IN AERS: 1.0000 | INHALATION_SPRAY | Freq: Four times a day (QID) | RESPIRATORY_TRACT | 2 refills | Status: DC | PRN

## 2020-12-12 NOTE — Telephone Encounter (Signed)
Refills sent in

## 2020-12-12 NOTE — Telephone Encounter (Signed)
  Prescription Request  12/12/2020  What is the name of the medication or equipment?  budesonide-formoterol (SYMBICORT) 80-4.5 MCG/ACT inhaler albuterol (PROVENTIL HFA;VENTOLIN HFA) 108 (90 Base) MCG/ACT inhaler  Have you contacted your pharmacy to request a refill? (if applicable) no pt had samples--now she wants a rx  Which pharmacy would you like this sent to? [facility]   Patient notified that their request is being sent to the clinical staff for review and that they should receive a response within 2 business days.

## 2021-01-13 ENCOUNTER — Other Ambulatory Visit: Payer: Self-pay

## 2021-01-13 ENCOUNTER — Ambulatory Visit: Payer: BLUE CROSS/BLUE SHIELD | Admitting: Family Medicine

## 2021-01-13 ENCOUNTER — Encounter: Payer: Self-pay | Admitting: Family Medicine

## 2021-01-13 VITALS — BP 125/84 | HR 80 | Temp 97.2°F | Ht 62.0 in | Wt 124.0 lb

## 2021-01-13 DIAGNOSIS — H6981 Other specified disorders of Eustachian tube, right ear: Secondary | ICD-10-CM

## 2021-01-13 DIAGNOSIS — Z720 Tobacco use: Secondary | ICD-10-CM

## 2021-01-13 DIAGNOSIS — J410 Simple chronic bronchitis: Secondary | ICD-10-CM

## 2021-01-13 MED ORDER — VARENICLINE TARTRATE 1 MG PO TABS: 1.0000 mg | ORAL_TABLET | Freq: Two times a day (BID) | ORAL | 3 refills | Status: DC

## 2021-01-13 NOTE — Progress Notes (Signed)
Subjective: CC: Chronic bronchitis; tobacco use disorder PCP: Janora Norlander, DO [id number] Holly Macdonald is a 55 y.o. female presenting to clinic today for:  1.  Chronic bronchitis, tobacco use disorder Patient reports that she was successful with the Chantix and in fact got down to 4 cigarettes/day from 1 pack/day over a year ago but it was recalled.  She would like to get back on Chantix.  Denies any hemoptysis, shortness of breath, difficulty swallowing, change in voice, unplanned weight loss or night sweats.  Currently smoking 1 pack/day again.  Reports smoker's cough with clear phlegm.  Just started back on Symbicort.  Has albuterol but has not required it lately  2.  Ear pain Patient reports a sharp intermittent stabbing ear pain that radiates down the right side of her neck.  Sometimes she will have some drainage from the ear but does not report any purulent or bloody drainage.  Not on any antihistamines.   ROS: Per HPI  Allergies  Allergen Reactions  . Morphine And Related Hives, Shortness Of Breath and Swelling  . Codeine Nausea And Vomiting  . Penicillins Hives   Past Medical History:  Diagnosis Date  . Allergy   . Anxiety   . Arthritis   . COPD (chronic obstructive pulmonary disease) ([place])   . Hyperlipidemia   . Sleep apnea     Current Outpatient Medications:  .  acetaminophen (TYLENOL) 650 MG CR tablet, Take 650 mg by mouth 2 (two) times daily as needed for pain., Disp: , Rfl:  .  albuterol (VENTOLIN HFA) 108 (90 Base) MCG/ACT inhaler, Inhale 1-2 puffs into the lungs every 6 (six) hours as needed., Disp: 18 g, Rfl: 2 .  budesonide-formoterol (SYMBICORT) 80-4.5 MCG/ACT inhaler, Inhale 2 puffs into the lungs 2 (two) times daily., Disp: 1 each, Rfl: 5 .  Multiple Vitamin (MULTIVITAMIN) tablet, Take 1 tablet by mouth daily., Disp: , Rfl:  .  pantoprazole (PROTONIX) 40 MG tablet, Take 1 tablet (40 mg total) by mouth daily., Disp: 30 tablet, Rfl: 3 .  benzonatate  (TESSALON) 100 MG capsule, Take 1-2 capsules (100-200 mg total) by mouth 3 (three) times daily as needed for cough. (Patient not taking: Reported on 01/13/2021), Disp: 40 capsule, Rfl: 0 Social History   Socioeconomic History  . Marital status: Married    Spouse name: Not on file  . Number of children: Not on file  . Years of education: Not on file  . Highest education level: Not on file  Occupational History  . Not on file  Tobacco Use  . Smoking status: Current Every Day Smoker    Packs/day: 0.50    Years: 40.00    Pack years: 20.00    Types: Cigarettes  . Smokeless tobacco: Never Used  Vaping Use  . Vaping Use: Never used  Substance and Sexual Activity  . Alcohol use: Yes    Alcohol/week: 1.0 standard drink    Types: 1 Cans of beer per week  . Drug use: No  . Sexual activity: Never  Other Topics Concern  . Not on file  Social History Narrative  . Not on file   Social Determinants of Health   Financial Resource Strain: Not on file  Food Insecurity: Not on file  Transportation Needs: Not on file  Physical Activity: Not on file  Stress: Not on file  Social Connections: Not on file  Intimate Partner Violence: Not on file   Family History  Problem Relation Age of Onset  .  Cancer Mother   . COPD Mother   . Hearing loss Father   . Early death Son   . Mental illness Son   . Depression Son   . Cancer Maternal Aunt   . COPD Paternal Uncle   . Cancer Maternal Grandmother   . COPD Maternal Grandmother   . Varicose Veins Maternal Grandfather   . Arthritis Paternal Grandmother   . COPD Paternal Grandfather   . Hearing loss Paternal Grandfather   . Cancer Maternal Aunt   . Colon cancer Neg Hx   . Esophageal cancer Neg Hx   . Rectal cancer Neg Hx   . Stomach cancer Neg Hx     Objective: Office vital signs reviewed. BP 125/84   Pulse 80   Temp (!) 97.2 F (36.2 C)   Ht 5\' 2"  (1.575 m)   Wt 124 lb (56.2 kg)   SpO2 100%   BMI 22.68 kg/m   Physical  Examination:  General: Awake, alert, well nourished, No acute distress HEENT: Normal; TM normal in appearance.  Mild fluid level that is clear behind the TM but no evidence of bacterial infection.  No erythema or bulging. Cardio: regular rate and rhythm, S1S2 heard, no murmurs appreciated Pulm: clear to auscultation bilaterally, no wheezes, rhonchi or rales; normal work of breathing on room air   Assessment/ Plan: 55 y.o. female   Tobacco use - Plan: varenicline (CHANTIX CONTINUING MONTH PAK) 1 MG tablet  Simple chronic bronchitis (HCC)  Eustachian tube dysfunction, right  Resume Chantix.  I do not believe this is on recall anymore.  She was successful decide if she should resume use.  Given no issues with nausea on this medicine I have gone ahead and just sent in the continuing month Pap but she will contact me if she develops nausea at which point we will switch over to the starter month then resume continuing  Continue Symbicort as maintenance medication.  These refills are in place  I suspect eustachian tube dysfunction on the right.  No evidence of infection.  Discussed OMT maneuver for eustachian tube drainage  No orders of the defined types were placed in this encounter.  No orders of the defined types were placed in this encounter.    Janora Norlander, DO [place] [phone]

## 2021-01-13 NOTE — Patient Instructions (Signed)

## 2021-07-21 ENCOUNTER — Telehealth: Payer: BC Managed Care – PPO | Admitting: Family Medicine

## 2021-07-21 DIAGNOSIS — J069 Acute upper respiratory infection, unspecified: Secondary | ICD-10-CM

## 2021-07-21 MED ORDER — FLUTICASONE PROPIONATE 50 MCG/ACT NA SUSP: 2.0000 | Freq: Every day | NASAL | 0 refills | Status: DC

## 2021-07-21 MED ORDER — BENZONATATE 100 MG PO CAPS: 100.0000 mg | ORAL_CAPSULE | Freq: Two times a day (BID) | ORAL | 0 refills | Status: DC | PRN

## 2021-07-21 NOTE — Progress Notes (Signed)
E-Visit for Sinus Problems  We are sorry that you are not feeling well.  Here is how we plan to help!  Based on what you have shared with me it looks like you have sinusitis.  Sinusitis is inflammation and infection in the sinus cavities of the head.  Based on your presentation I believe you most likely have Acute Viral Sinusitis.This is an infection most likely caused by a virus. There is not specific treatment for viral sinusitis other than to help you with the symptoms until the infection runs its course.  You may use an oral decongestant such as Mucinex D or if you have glaucoma or high blood pressure use plain Mucinex. Saline nasal spray help and can safely be used as often as needed for congestion, I have prescribed: Fluticasone nasal spray two sprays in each nostril once a day and Tessalon perles for cough.   Some authorities believe that zinc sprays or the use of Echinacea may shorten the course of your symptoms.  Sinus infections are not as easily transmitted as other respiratory infection, however we still recommend that you avoid close contact with loved ones, especially the very young and elderly.  Remember to wash your hands thoroughly throughout the day as this is the number one way to prevent the spread of infection!  Home Care: Only take medications as instructed by your medical team. Do not take these medications with alcohol. A steam or ultrasonic humidifier can help congestion.  You can place a towel over your head and breathe in the steam from hot water coming from a faucet. Avoid close contacts especially the very young and the elderly. Cover your mouth when you cough or sneeze. Always remember to wash your hands.  Get Help Right Away If: You develop worsening fever or sinus pain. You develop a severe head ache or visual changes. Your symptoms persist after you have completed your treatment plan.  Make sure you Understand these instructions. Will watch your  condition. Will get help right away if you are not doing well or get worse.   Thank you for choosing an e-visit.  Your e-visit answers were reviewed by a board certified advanced clinical practitioner to complete your personal care plan. Depending upon the condition, your plan could have included both over the counter or prescription medications.  Please review your pharmacy choice. Make sure the pharmacy is open so you can pick up prescription now. If there is a problem, you may contact your provider through [employer] and have the prescription routed to another pharmacy.  Your safety is important to [place]. If you have drug allergies check your prescription carefully.   For the next 24 hours you can use MyChart to ask questions about today's visit, request a non-urgent call back, or ask for a work or school excuse. You will get an email in the next two days asking about your experience. I hope that your e-visit has been valuable and will speed your recovery.   I provided 5 minutes of non face-to-face time during this encounter for chart review, medication and order placement, as well as and documentation.

## 2021-11-07 ENCOUNTER — Encounter: Payer: Self-pay | Admitting: Nurse Practitioner

## 2021-11-07 ENCOUNTER — Telehealth (INDEPENDENT_AMBULATORY_CARE_PROVIDER_SITE_OTHER): Payer: BC Managed Care – PPO | Admitting: Nurse Practitioner

## 2021-11-07 DIAGNOSIS — A084 Viral intestinal infection, unspecified: Secondary | ICD-10-CM

## 2021-11-07 MED ORDER — ONDANSETRON HCL 4 MG PO TABS: 4.0000 mg | ORAL_TABLET | Freq: Three times a day (TID) | ORAL | 0 refills | Status: DC | PRN

## 2021-11-07 NOTE — Progress Notes (Signed)
? ?Virtual Visit Consent  ? ?Holly Macdonald, you are scheduled for a virtual visit with Mary-Margaret Hassell Done, [place], a [facility] provider, today.   ?  ?Just as with appointments in the office, your consent must be obtained to participate.  Your consent will be active for this visit and any virtual visit you may have with one of our providers in the next 365 days.   ?  ?If you have a MyChart account, a copy of this consent can be sent to you electronically.  All virtual visits are billed to your insurance company just like a traditional visit in the office.   ? ?As this is a virtual visit, video technology does not allow for your provider to perform a traditional examination.  This may limit your provider's ability to fully assess your condition.  If your provider identifies any concerns that need to be evaluated in person or the need to arrange testing (such as labs, EKG, etc.), we will make arrangements to do so.   ?  ?Although advances in technology are sophisticated, we cannot ensure that it will always work on either your end or our end.  If the connection with a video visit is poor, the visit may have to be switched to a telephone visit.  With either a video or telephone visit, we are not always able to ensure that we have a secure connection.    ? ?I need to obtain your verbal consent now.   Are you willing to proceed with your visit today? YES ?  ?Holly Macdonald has provided verbal consent on 11/07/2021 for a virtual visit (video or telephone). ?  ?Mary-Margaret Hassell Done, FNP  ? ?Date: 11/07/2021 12:59 PM ? ? ?Virtual Visit via Video Note  ? ?I, Mary-Margaret Hassell Done, connected with Holly Macdonald ([id number], Jun 13, 1966) on 11/07/21 at  5:00 PM EST by a video-enabled telemedicine application and verified that I am speaking with the correct person using two identifiers. ? ?Location: ?Patient: Virtual Visit Location Patient: Home ?Provider: Virtual Visit Location Provider: Mobile ?  ?I discussed the  limitations of evaluation and management by telemedicine and the availability of in person appointments. The patient expressed understanding and agreed to proceed.   ? ?History of Present Illness: ?Holly Macdonald is a 56 y.o. who identifies as a female who was assigned female at birth, and is being seen today for tooth abcess. ? ?HPI: Patient had a tooth pul yesterday. Woke up this morning nauseated, vomiting and diarrhea. Low grade fever. Has had > then 6 diarrhea stolls since this morning. Denies any abdominal pain. No blood in stool. ?  ?Review of Systems  ?Constitutional:  Fever: low grade99.2.  ?Gastrointestinal:  Positive for diarrhea, nausea and vomiting. Negative for abdominal pain, constipation and melena.  ? ?Problems:  ?Patient Active Problem List  ? Diagnosis Date Noted  ? Elevated blood pressure reading 06/01/2019  ?  ?Allergies:  ?Allergies  ?Allergen Reactions  ? Morphine And Related Hives, Shortness Of Breath and Swelling  ? Codeine Nausea And Vomiting  ? Penicillins Hives  ? ?Medications:  ?Current Outpatient Medications:  ?  acetaminophen (TYLENOL) 650 MG CR tablet, Take 650 mg by mouth 2 (two) times daily as needed for pain., Disp: , Rfl:  ?  albuterol (VENTOLIN HFA) 108 (90 Base) MCG/ACT inhaler, Inhale 1-2 puffs into the lungs every 6 (six) hours as needed., Disp: 18 g, Rfl: 2 ?  benzonatate (TESSALON) 100 MG capsule, Take 1 capsule (100 mg total)  by mouth 2 (two) times daily as needed for cough., Disp: 20 capsule, Rfl: 0 ?  budesonide-formoterol (SYMBICORT) 80-4.5 MCG/ACT inhaler, Inhale 2 puffs into the lungs 2 (two) times daily., Disp: 1 each, Rfl: 5 ?  fluticasone (FLONASE) 50 MCG/ACT nasal spray, Place 2 sprays into both nostrils daily., Disp: 16 g, Rfl: 0 ?  Multiple Vitamin (MULTIVITAMIN) tablet, Take 1 tablet by mouth daily., Disp: , Rfl:  ?  pantoprazole (PROTONIX) 40 MG tablet, Take 1 tablet (40 mg total) by mouth daily., Disp: 30 tablet, Rfl: 3 ?  varenicline (CHANTIX CONTINUING  MONTH PAK) 1 MG tablet, Take 1 tablet (1 mg total) by mouth 2 (two) times daily., Disp: 60 tablet, Rfl: 3 ? ?Observations/Objective: ?Patient is well-developed, well-nourished in no acute distress.  ?Resting comfortably at home.  ?Head is normocephalic, atraumatic.  ?No labored breathing.  ?Speech is clear and coherent with logical content.  ?Patient is alert and oriented at baseline.  ? ? ?Assessment and Plan: ? ?Holly Macdonald in today with chief complaint of Dental Pain ? ? ?1. Viral gastroenteritis ?First 24 Hours-Clear liquids ? popsicles ? Jello ? gatorade ? Sprite ?Second 24 hours-Add Full liquids ( Liquids you cant see through) ?Third 24 hours- Bland diet ( foods that are baked or broiled) ? *avoiding fried foods and highly spiced foods* ?During these 3 days ? Avoid milk, cheese, ice cream or any other dairy products ? Avoid caffeine- REMEMBER Mt. Dew and Mello Yellow contain lots of caffeine ?You should eat and drink in  Frequent small volumes ?If no improvement in symptoms or worsen in 2-3 days should RETRUN TO OFFICE or go to ER! ?Imodium AD OTC for diarrhea ?Meds ordered this encounter  ?Medications  ? ondansetron (ZOFRAN) 4 MG tablet  ?  Sig: Take 1 tablet (4 mg total) by mouth every 8 (eight) hours as needed for nausea or vomiting.  ?  Dispense:  20 tablet  ?  Refill:  0  ?  Order Specific Question:   Supervising Provider  ?  Answer:   Caryl Pina A [[id number]]  ? ? ? ? ? ? ?Follow Up Instructions: ?I discussed the assessment and treatment plan with the patient. The patient was provided an opportunity to ask questions and all were answered. The patient agreed with the plan and demonstrated an understanding of the instructions.  A copy of instructions were sent to the patient via MyChart. ? ?The patient was advised to call back or seek an in-person evaluation if the symptoms worsen or if the condition fails to improve as anticipated. ? ?Time:  ?I spent 9 minutes with the patient via telehealth  technology discussing the above problems/concerns.   ? ?Mary-Margaret Hassell Done, FNP ? ?

## 2021-11-07 NOTE — Patient Instructions (Signed)

## 2021-11-20 ENCOUNTER — Ambulatory Visit: Payer: BC Managed Care – PPO | Admitting: Family Medicine

## 2021-11-23 ENCOUNTER — Ambulatory Visit: Payer: BC Managed Care – PPO | Admitting: Family Medicine

## 2021-11-23 ENCOUNTER — Encounter: Payer: Self-pay | Admitting: Family Medicine

## 2021-11-23 VITALS — BP 129/74 | HR 104 | Temp 98.0°F | Ht 62.0 in | Wt 129.4 lb

## 2021-11-23 DIAGNOSIS — Z791 Long term (current) use of non-steroidal anti-inflammatories (NSAID): Secondary | ICD-10-CM | POA: Diagnosis not present

## 2021-11-23 DIAGNOSIS — R590 Localized enlarged lymph nodes: Secondary | ICD-10-CM

## 2021-11-23 DIAGNOSIS — G8929 Other chronic pain: Secondary | ICD-10-CM

## 2021-11-23 DIAGNOSIS — M542 Cervicalgia: Secondary | ICD-10-CM

## 2021-11-23 NOTE — Progress Notes (Signed)
? ?Acute Office Visit ? ?Subjective:  ? ? Patient ID: Holly Macdonald, female    DOB: [birth year], 56 y.o.   MRN: [id number] ? ?Chief Complaint  ?Patient presents with  ? Jaw Pain  ? ? ?HPI ?Patient is in today for pain on the anterior right side of her neck for a few years. She had a tooth pulled on this side a few weeks ago. The dentist thought that this would help with the pain but the pain has actually worsened. The pain in constant. It is achy and sometimes she has sharp shooting pain that radiates down her neck. The pain is a 4/10. She can feel a "knot" in this area that has been present since the pain started. The knot is also very tender to the touch. She has tried tylenol and advil with mild movement. She has been taking this regularly for the last few years. She reports that yawning makes the pain worse. She denies posterior neck pain. Denies injury. She sometimes has a weird "squeal" noise in her right ear. She reports chronic nasal congestion that she takes flonase for. She also reports a chronic cough due to smoking. She denies postnasal drip. She denies jaw pain or dental pain since having the tooth pulled. She has not been on any antibiotics recently. She denies dental infections or abscesses. She did have a cyst in her mouth that resolved on its own a few weeks ago. Her dentist planned to extract this and biopsy it but it resolved on its own before this happened. She denies fever or chills. Denies erythema or difficulty swallowing.  ? ?Past Medical History:  ?Diagnosis Date  ? Allergy   ? Anxiety   ? Arthritis   ? COPD (chronic obstructive pulmonary disease) ([place])   ? Hyperlipidemia   ? Sleep apnea   ? ? ?Past Surgical History:  ?Procedure Laterality Date  ? HYSTERECTOMY ABDOMINAL WITH SALPINGECTOMY    ? 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 ? ?Family History  ?Problem Relation Age of Onset  ? Cancer Mother   ? COPD Mother   ? Hearing loss Father   ? Early death Son   ? Mental illness Son   ? Depression Son   ?  Cancer Maternal Aunt   ? COPD Paternal Uncle   ? Cancer Maternal Grandmother   ? COPD Maternal Grandmother   ? Varicose Veins Maternal Grandfather   ? Arthritis Paternal Grandmother   ? COPD Paternal Grandfather   ? Hearing loss Paternal Grandfather   ? Cancer Maternal Aunt   ? Colon cancer Neg Hx   ? Esophageal cancer Neg Hx   ? Rectal cancer Neg Hx   ? Stomach cancer Neg Hx   ? ? ?Social History  ? ?Socioeconomic History  ? Marital status: Married  ?  Spouse name: Not on file  ? Number of children: Not on file  ? Years of education: Not on file  ? Highest education level: Not on file  ?Occupational History  ? Not on file  ?Tobacco Use  ? Smoking status: Every Day  ?  Packs/day: 0.50  ?  Years: 40.00  ?  Pack years: 20.00  ?  Types: Cigarettes  ? Smokeless tobacco: Never  ?Vaping Use  ? Vaping Use: Never used  ?Substance and Sexual Activity  ? Alcohol use: Yes  ?  Alcohol/week: 1.0 standard drink  ?  Types: 1 Cans of beer per week  ? Drug use: No  ?  Sexual activity: Never  ?Other Topics Concern  ? Not on file  ?Social History Narrative  ? Not on file  ? ?Social Determinants of Health  ? ?Financial Resource Strain: Not on file  ?Food Insecurity: Not on file  ?Transportation Needs: Not on file  ?Physical Activity: Not on file  ?Stress: Not on file  ?Social Connections: Not on file  ?Intimate Partner Violence: Not on file  ? ? ?Outpatient Medications Prior to Visit  ?Medication Sig Dispense Refill  ? acetaminophen (TYLENOL) 650 MG CR tablet Take 650 mg by mouth 2 (two) times daily as needed for pain.    ? albuterol (VENTOLIN HFA) 108 (90 Base) MCG/ACT inhaler Inhale 1-2 puffs into the lungs every 6 (six) hours as needed. 18 g 2  ? budesonide-formoterol (SYMBICORT) 80-4.5 MCG/ACT inhaler Inhale 2 puffs into the lungs 2 (two) times daily. 1 each 5  ? fluticasone (FLONASE) 50 MCG/ACT nasal spray Place 2 sprays into both nostrils daily. 16 g 0  ? Multiple Vitamin (MULTIVITAMIN) tablet Take 1 tablet by mouth daily.    ?  varenicline (CHANTIX CONTINUING MONTH PAK) 1 MG tablet Take 1 tablet (1 mg total) by mouth 2 (two) times daily. 60 tablet 3  ? benzonatate (TESSALON) 100 MG capsule Take 1 capsule (100 mg total) by mouth 2 (two) times daily as needed for cough. 20 capsule 0  ? ondansetron (ZOFRAN) 4 MG tablet Take 1 tablet (4 mg total) by mouth every 8 (eight) hours as needed for nausea or vomiting. 20 tablet 0  ? pantoprazole (PROTONIX) 40 MG tablet Take 1 tablet (40 mg total) by mouth daily. 30 tablet 3  ? ?No facility-administered medications prior to visit.  ? ? ?Allergies  ?Allergen Reactions  ? Morphine And Related Hives, Shortness Of Breath and Swelling  ? Codeine Nausea And Vomiting  ? Penicillins Hives  ? ? ?Review of Systems ?As per HPI.  ?   ?Objective:  ?  ?Physical Exam ?Vitals and nursing note reviewed.  ?Constitutional:   ?   Appearance: Normal appearance.  ?HENT:  ?   Head: Normocephalic and atraumatic.  ?   Right Ear: Tympanic membrane, ear canal and external ear normal.  ?   Left Ear: Tympanic membrane, ear canal and external ear normal.  ?   Nose: Congestion present.  ?   Mouth/Throat:  ?   Mouth: Mucous membranes are moist.  ?   Dentition: No dental tenderness or dental caries.  ?   Tongue: No lesions.  ?   Pharynx: Oropharynx is clear. No oropharyngeal exudate or posterior oropharyngeal erythema.  ?   Tonsils: No tonsillar exudate or tonsillar abscesses.  ?Neck:  ?   Thyroid: No thyroid mass, thyromegaly or thyroid tenderness.  ?   Vascular: No JVD.  ?   Trachea: Trachea normal.  ?   Comments: Enlarged tonsillar lymph node on right side.  ?Pulmonary:  ?   Effort: Pulmonary effort is normal. No respiratory distress.  ?Musculoskeletal:  ?   Cervical back: No edema, erythema or rigidity. Muscular tenderness present. No pain with movement or spinous process tenderness. Normal range of motion.  ?   Right lower leg: No edema.  ?   Left lower leg: No edema.  ?Lymphadenopathy:  ?   Cervical: Cervical adenopathy present.   ?   Right cervical: Superficial cervical adenopathy present.  ?Skin: ?   General: Skin is warm and dry.  ?Neurological:  ?   General: No focal deficit present.  ?  Mental Status: She is alert and oriented to person, place, and time.  ?Psychiatric:     ?   Mood and Affect: Mood normal.     ?   Behavior: Behavior normal.  ? ? ?BP 129/74   Pulse (!) 104   Temp 98 ?F (36.7 ?C) (Temporal)   Ht '5\' 2"'$  (1.575 m)   Wt 129 lb 6 oz (58.7 kg)   BMI 23.66 kg/m?  ?Wt Readings from Last 3 Encounters:  ?11/23/21 129 lb 6 oz (58.7 kg)  ?01/13/21 124 lb (56.2 kg)  ?07/21/19 124 lb (56.2 kg)  ? ? ?Health Maintenance Due  ?Topic Date Due  ? COVID-19 Vaccine (1) Never done  ? HIV Screening  Never done  ? Zoster Vaccines- Shingrix (1 of 2) Never done  ? ? ?There are no preventive care reminders to display for this patient. ? ? ?Lab Results  ?Component Value Date  ? TSH 1.380 06/09/2019  ? ?Lab Results  ?Component Value Date  ? WBC 7.4 06/09/2019  ? HGB 13.9 06/09/2019  ? HCT 40.7 06/09/2019  ? MCV 91 06/09/2019  ? PLT 272 06/09/2019  ? ?Lab Results  ?Component Value Date  ? NA 141 06/09/2019  ? K 4.8 06/09/2019  ? CO2 24 06/09/2019  ? GLUCOSE 77 06/09/2019  ? BUN 10 06/09/2019  ? CREATININE 0.88 06/09/2019  ? BILITOT 0.3 06/09/2019  ? ALKPHOS 103 06/09/2019  ? AST 15 06/09/2019  ? ALT 10 06/09/2019  ? PROT 7.0 06/09/2019  ? ALBUMIN 4.0 06/09/2019  ? CALCIUM 9.8 06/09/2019  ? ?Lab Results  ?Component Value Date  ? CHOL 247 (H) 06/09/2019  ? ?Lab Results  ?Component Value Date  ? HDL 46 06/09/2019  ? ?Lab Results  ?Component Value Date  ? LDLCALC 177 (H) 06/09/2019  ? ?Lab Results  ?Component Value Date  ? TRIG 133 06/09/2019  ? ?Lab Results  ?Component Value Date  ? CHOLHDL 5.4 (H) 06/09/2019  ? ?No results found for: HGBA1C ? ?   ?Assessment & Plan:  ? ?Kadasia was seen today for jaw pain. ? ?Diagnoses and all orders for this visit: ? ?Chronic neck pain ?For 2 years with worsening 3 weeks ago. ? Enlarged tonsillar lymph node vs cyst  noted at area of reported "knot" with tenderness. No erythema. Discussed possibility that this may be pressing on a nerve and causing her pain. Will obtain US to assess. CBC and CMP pending. Discussed antiin

## 2021-11-24 ENCOUNTER — Other Ambulatory Visit: Payer: Self-pay | Admitting: Family Medicine

## 2021-11-24 DIAGNOSIS — G8929 Other chronic pain: Secondary | ICD-10-CM

## 2021-11-24 LAB — CBC WITH DIFFERENTIAL/PLATELET
Basophils Absolute: 0.1 10*3/uL (ref 0.0–0.2)
Basos: 1 %
EOS (ABSOLUTE): 0.1 10*3/uL (ref 0.0–0.4)
Eos: 2 %
Hematocrit: 42.2 % (ref 34.0–46.6)
Hemoglobin: 14.1 g/dL (ref 11.1–15.9)
Immature Grans (Abs): 0 10*3/uL (ref 0.0–0.1)
Immature Granulocytes: 0 %
Lymphocytes Absolute: 2.2 10*3/uL (ref 0.7–3.1)
Lymphs: 28 %
MCH: 30.3 pg (ref 26.6–33.0)
MCHC: 33.4 g/dL (ref 31.5–35.7)
MCV: 91 fL (ref 79–97)
Monocytes Absolute: 0.6 10*3/uL (ref 0.1–0.9)
Monocytes: 7 %
Neutrophils Absolute: 5 10*3/uL (ref 1.4–7.0)
Neutrophils: 62 %
Platelets: 265 10*3/uL (ref 150–450)
RBC: 4.65 x10E6/uL (ref 3.77–5.28)
RDW: 13 % (ref 11.7–15.4)
WBC: 7.9 10*3/uL (ref 3.4–10.8)

## 2021-11-24 LAB — CMP14+EGFR
ALT: 14 IU/L (ref 0–32)
AST: 15 IU/L (ref 0–40)
Albumin/Globulin Ratio: 1.8 (ref 1.2–2.2)
Albumin: 4.4 g/dL (ref 3.8–4.9)
Alkaline Phosphatase: 103 IU/L (ref 44–121)
BUN/Creatinine Ratio: 7 — ABNORMAL LOW (ref 9–23)
BUN: 6 mg/dL (ref 6–24)
Bilirubin Total: 0.2 mg/dL (ref 0.0–1.2)
CO2: 23 mmol/L (ref 20–29)
Calcium: 9.7 mg/dL (ref 8.7–10.2)
Chloride: 102 mmol/L (ref 96–106)
Creatinine, Ser: 0.87 mg/dL (ref 0.57–1.00)
Globulin, Total: 2.5 g/dL (ref 1.5–4.5)
Glucose: 80 mg/dL (ref 70–99)
Potassium: 4.8 mmol/L (ref 3.5–5.2)
Sodium: 138 mmol/L (ref 134–144)
Total Protein: 6.9 g/dL (ref 6.0–8.5)
eGFR: 78 mL/min/{1.73_m2} (ref >59)

## 2021-11-24 MED ORDER — GABAPENTIN 300 MG PO CAPS: 300.0000 mg | ORAL_CAPSULE | Freq: Every day | ORAL | 0 refills | Status: DC

## 2021-11-24 NOTE — Progress Notes (Signed)
Pt has seen the message on MyChart from her labs yesterday regarding this rx, will close encounter. ?

## 2021-12-05 ENCOUNTER — Ambulatory Visit (HOSPITAL_COMMUNITY)
Admission: RE | Admit: 2021-12-05 | Discharge: 2021-12-05 | Disposition: A | Discharge status: Home / Self Care | Payer: BC Managed Care – PPO | Source: Ambulatory Visit | Attending: Family Medicine | Admitting: Family Medicine

## 2021-12-05 DIAGNOSIS — M542 Cervicalgia: Secondary | ICD-10-CM | POA: Diagnosis not present

## 2021-12-05 DIAGNOSIS — G8929 Other chronic pain: Secondary | ICD-10-CM | POA: Insufficient documentation

## 2021-12-05 DIAGNOSIS — R590 Localized enlarged lymph nodes: Secondary | ICD-10-CM | POA: Diagnosis not present

## 2022-01-05 ENCOUNTER — Ambulatory Visit: Payer: BC Managed Care – PPO | Admitting: Family Medicine

## 2022-02-13 ENCOUNTER — Other Ambulatory Visit: Payer: Self-pay | Admitting: Family Medicine

## 2022-02-13 DIAGNOSIS — G8929 Other chronic pain: Secondary | ICD-10-CM

## 2022-02-28 ENCOUNTER — Telehealth: Payer: BC Managed Care – PPO | Admitting: Physician Assistant

## 2022-02-28 DIAGNOSIS — R0689 Other abnormalities of breathing: Secondary | ICD-10-CM | POA: Diagnosis not present

## 2022-02-28 DIAGNOSIS — F4321 Adjustment disorder with depressed mood: Secondary | ICD-10-CM

## 2022-02-28 DIAGNOSIS — F419 Anxiety disorder, unspecified: Secondary | ICD-10-CM

## 2022-02-28 DIAGNOSIS — R0789 Other chest pain: Secondary | ICD-10-CM

## 2022-02-28 MED ORDER — PREDNISONE 20 MG PO TABS: 20.0000 mg | ORAL_TABLET | Freq: Every day | ORAL | 0 refills | Status: DC

## 2022-02-28 MED ORDER — HYDROXYZINE PAMOATE 25 MG PO CAPS: 25.0000 mg | ORAL_CAPSULE | Freq: Three times a day (TID) | ORAL | 0 refills | Status: DC | PRN

## 2022-02-28 NOTE — Progress Notes (Signed)
Virtual Visit Consent   Holly Macdonald, you are scheduled for a virtual visit with a [facility] provider today. Just as with appointments in the office, your consent must be obtained to participate. Your consent will be active for this visit and any virtual visit you may have with one of our providers in the next 365 days. If you have a MyChart account, a copy of this consent can be sent to you electronically.  As this is a virtual visit, video technology does not allow for your provider to perform a traditional examination. This may limit your provider's ability to fully assess your condition. If your provider identifies any concerns that need to be evaluated in person or the need to arrange testing (such as labs, EKG, etc.), we will make arrangements to do so. Although advances in technology are sophisticated, we cannot ensure that it will always work on either your end or our end. If the connection with a video visit is poor, the visit may have to be switched to a telephone visit. With either a video or telephone visit, we are not always able to ensure that we have a secure connection.  By engaging in this virtual visit, you consent to the provision of healthcare and authorize for your insurance to be billed (if applicable) for the services provided during this visit. Depending on your insurance coverage, you may receive a charge related to this service.  I need to obtain your verbal consent now. Are you willing to proceed with your visit today? Holly Macdonald has provided verbal consent on 02/28/2022 for a virtual visit (video or telephone). Mar Daring, PA-C  Date: 02/28/2022 4:50 PM  Virtual Visit via Video Note   I, Mar Daring, connected with  Holly Macdonald  ([id number], 1966/05/01) on 02/28/22 at  3:00 PM EDT by a video-enabled telemedicine application and verified that I am speaking with the correct person using two identifiers.  Location: Patient: Virtual  Visit Location Patient: Home Provider: Virtual Visit Location Provider: Home Office   I discussed the limitations of evaluation and management by telemedicine and the availability of in person appointments. The patient expressed understanding and agreed to proceed.    History of Present Illness: Holly Macdonald is a 56 y.o. who identifies as a female who was assigned female at birth, and is being seen today for panic attacks and anxiety following recent death of her daughter, passed a week ago yesterday. She reports having increased shortness of breath that comes and goes. Having increased issues with sleep and shortness of breath does worsen at night. Has been using her inhalers without relief. Did have an episode of chest heaviness last night, but took omeprazole and symptoms improved. Did complete and E-visit earlier and was advised for in-person evaluation. Called PCP and unable to be seen until July 10th. Was advised for ER evaluation x 2, but patient declines at this time, thinking more related to anxiety and grief.    Problems:  Patient Active Problem List   Diagnosis Date Noted   Elevated blood pressure reading 06/01/2019    Allergies:  Allergies  Allergen Reactions   Morphine And Related Hives, Shortness Of Breath and Swelling   Codeine Nausea And Vomiting   Penicillins Hives   Medications:  Current Outpatient Medications:    hydrOXYzine (VISTARIL) 25 MG capsule, Take 1 capsule (25 mg total) by mouth every 8 (eight) hours as needed., Disp: 30 capsule, Rfl: 0   predniSONE (DELTASONE) 20  MG tablet, Take 1 tablet (20 mg total) by mouth daily with breakfast., Disp: 5 tablet, Rfl: 0   acetaminophen (TYLENOL) 650 MG CR tablet, Take 650 mg by mouth 2 (two) times daily as needed for pain., Disp: , Rfl:    albuterol (VENTOLIN HFA) 108 (90 Base) MCG/ACT inhaler, Inhale 1-2 puffs into the lungs every 6 (six) hours as needed., Disp: 18 g, Rfl: 2   budesonide-formoterol (SYMBICORT) 80-4.5  MCG/ACT inhaler, Inhale 2 puffs into the lungs 2 (two) times daily., Disp: 1 each, Rfl: 5   fluticasone (FLONASE) 50 MCG/ACT nasal spray, Place 2 sprays into both nostrils daily., Disp: 16 g, Rfl: 0   gabapentin (NEURONTIN) 300 MG capsule, Take 1 capsule (300 mg total) by mouth at bedtime. (NEEDS TO BE SEEN BEFORE NEXT REFILL), Disp: 30 capsule, Rfl: 0   Multiple Vitamin (MULTIVITAMIN) tablet, Take 1 tablet by mouth daily., Disp: , Rfl:    varenicline (CHANTIX CONTINUING MONTH PAK) 1 MG tablet, Take 1 tablet (1 mg total) by mouth 2 (two) times daily., Disp: 60 tablet, Rfl: 3  Observations/Objective: Patient is well-developed, well-nourished in no acute distress.  Resting comfortably at home.  Head is normocephalic, atraumatic.  No labored breathing.  Speech is clear and coherent with logical content.  Patient is alert and oriented at baseline.    Assessment and Plan: 1. Difficulty breathing - predniSONE (DELTASONE) 20 MG tablet; Take 1 tablet (20 mg total) by mouth daily with breakfast.  Dispense: 5 tablet; Refill: 0  2. Grief reaction - hydrOXYzine (VISTARIL) 25 MG capsule; Take 1 capsule (25 mg total) by mouth every 8 (eight) hours as needed.  Dispense: 30 capsule; Refill: 0  - Discussed could be anxiety from grief alone, could be associated bronchospasm due to poor air quality, could be both, or could be something more severe - Will give hydroxyzine for anxiety and panic attacks, and sleep - Prednisone for possible COPD exacerbation - Discussed grief counseling - If symptoms fail to improve or worsen she needs to be seen in person at local UC or ER fur further evaluation  Follow Up Instructions: I discussed the assessment and treatment plan with the patient. The patient was provided an opportunity to ask questions and all were answered. The patient agreed with the plan and demonstrated an understanding of the instructions.  A copy of instructions were sent to the patient via MyChart  unless otherwise noted below.    The patient was advised to call back or seek an in-person evaluation if the symptoms worsen or if the condition fails to improve as anticipated.  Time:  I spent 20 minutes with the patient via telehealth technology discussing the above problems/concerns.    Mar Daring, PA-C

## 2022-02-28 NOTE — Progress Notes (Signed)
Because of chest pain and anxiety along with the SOB, I feel your condition warrants further evaluation and I recommend that you be seen in a face to face visit. I am mostly worried about panic attack and anxiety as causes which we cannot treat via e-visit, but you need other more serious causes ruled out and proper treatment to be given. I am so sorry for your loss.    NOTE: There will be NO CHARGE for this eVisit   If you are having a true medical emergency please call 911.      For an urgent face to face visit, [facility] has seven urgent care centers for your convenience:     [facility] [place] Get Driving Directions [phone] [address] [place], [place] [address]    [facility]) Get Driving Directions [phone] [place], [place] [address]  [facility] [place] [facility]) Get Driving Directions [phone] [address] [place],  [place]  [address]  [facility] [address] Get Driving Directions  [phone] [place],  [place] [address]   [facility] Get Driving Directions [phone] [address], [place], [place] [address]   [facility] Get Driving Directions  [phone] [address], [place] [address]   [facility] [place] Get Driving Directions [phone] [address] [place],  [address]  Your MyChart E-visit questionnaire answers were reviewed by a board certified advanced clinical practitioner to complete your personal care plan based on your specific symptoms.  Thank you for using e-Visits.

## 2022-02-28 NOTE — Patient Instructions (Signed)
Wilmon Arms Blakely, thank you for joining Mar Daring, PA-C for today's virtual visit.  While this provider is not your primary care provider (PCP), if your PCP is located in our provider database this encounter information will be shared with them immediately following your visit.  Consent: (Patient) Holly Macdonald provided verbal consent for this virtual visit at the beginning of the encounter.  Current Medications:  Current Outpatient Medications:    hydrOXYzine (VISTARIL) 25 MG capsule, Take 1 capsule (25 mg total) by mouth every 8 (eight) hours as needed., Disp: 30 capsule, Rfl: 0   predniSONE (DELTASONE) 20 MG tablet, Take 1 tablet (20 mg total) by mouth daily with breakfast., Disp: 5 tablet, Rfl: 0   acetaminophen (TYLENOL) 650 MG CR tablet, Take 650 mg by mouth 2 (two) times daily as needed for pain., Disp: , Rfl:    albuterol (VENTOLIN HFA) 108 (90 Base) MCG/ACT inhaler, Inhale 1-2 puffs into the lungs every 6 (six) hours as needed., Disp: 18 g, Rfl: 2   budesonide-formoterol (SYMBICORT) 80-4.5 MCG/ACT inhaler, Inhale 2 puffs into the lungs 2 (two) times daily., Disp: 1 each, Rfl: 5   fluticasone (FLONASE) 50 MCG/ACT nasal spray, Place 2 sprays into both nostrils daily., Disp: 16 g, Rfl: 0   gabapentin (NEURONTIN) 300 MG capsule, Take 1 capsule (300 mg total) by mouth at bedtime. (NEEDS TO BE SEEN BEFORE NEXT REFILL), Disp: 30 capsule, Rfl: 0   Multiple Vitamin (MULTIVITAMIN) tablet, Take 1 tablet by mouth daily., Disp: , Rfl:    varenicline (CHANTIX CONTINUING MONTH PAK) 1 MG tablet, Take 1 tablet (1 mg total) by mouth 2 (two) times daily., Disp: 60 tablet, Rfl: 3   Medications ordered in this encounter:  Meds ordered this encounter  Medications   hydrOXYzine (VISTARIL) 25 MG capsule    Sig: Take 1 capsule (25 mg total) by mouth every 8 (eight) hours as needed.    Dispense:  30 capsule    Refill:  0    Order Specific Question:   Supervising Provider    Answer:    MILLER, BRIAN [[id number]]   predniSONE (DELTASONE) 20 MG tablet    Sig: Take 1 tablet (20 mg total) by mouth daily with breakfast.    Dispense:  5 tablet    Refill:  0    Order Specific Question:   Supervising Provider    Answer:   Sabra Heck, BRIAN [[id number]]     *If you need refills on other medications prior to your next appointment, please contact your pharmacy*  Follow-Up: Call back or seek an in-person evaluation if the symptoms worsen or if the condition fails to improve as anticipated.  Other Instructions Managing Loss, Adult People experience loss in many different ways throughout their lives. Events such as moving, changing jobs, and losing friends can create a sense of loss. The loss may be as serious as a major health change, divorce, death of a pet, or death of a loved one. All of these types of loss are likely to create a physical and emotional reaction known as grief. Grief is the result of a major change or an absence of something or someone that you count on. Grief is a normal reaction to loss. A variety of factors can affect your grieving experience, including: The nature of your loss. Your relationship to what or whom you lost. Your understanding of grief and how to manage it. Your support system. Be aware that when grief becomes extreme, it can lead to  more severe issues like isolation, depression, anxiety, or suicidal thoughts. Talk with your health care provider if you have any of these issues. How to manage lifestyle changes Keep to your normal routine as much as possible. If you have trouble focusing or doing normal activities, it is acceptable to take some time away from your normal routine. Spend time with friends and loved ones. Eat a healthy diet, get plenty of sleep, and rest when you feel tired. How to recognize changes  The way that you deal with your grief will affect your ability to function as you normally do. When grieving, you may experience these  changes: Numbness, shock, sadness, anxiety, anger, denial, and guilt. Thoughts about death. Unexpected crying. A physical sensation of emptiness in your stomach. Problems sleeping and eating. Tiredness (fatigue). Loss of interest in normal activities. Dreaming about or imagining seeing the person who died. A need to remember what or whom you lost. Difficulty thinking about anything other than your loss for a period of time. Relief. If you have been expecting the loss for a while, you may feel a sense of relief when it happens. Follow these instructions at home: Activity Express your feelings in healthy ways, such as: Talking with others about your loss. It may be helpful to find others who have had a similar loss, such as a support group. Writing down your feelings in a journal. Doing physical activities to release stress and emotional energy. Doing creative activities like painting, sculpting, or playing or listening to music. Practicing resilience. This is the ability to recover and adjust after facing challenges. Reading some resources that encourage resilience may help you to learn ways to practice those behaviors.  General instructions Be patient with yourself and others. Allow the grieving process to happen, and remember that grieving takes time. It is likely that you may never feel completely done with some grief. You may find a way to move on while still cherishing memories and feelings about your loss. Accepting your loss is a process. It can take months or longer to adjust. Keep all follow-up visits. This is important. Where to find support To get support for managing loss: Ask your health care provider for help and recommendations, such as grief counseling or therapy. Think about joining a support group for people who are managing a loss. Where to find more information You can find more information about managing loss from: American Society of Clinical Oncology:  www.cancer.net American Psychological Association: TVStereos.ch Contact a health care provider if: Your grief is extreme and keeps getting worse. You have ongoing grief that does not improve. Your body shows symptoms of grief, such as illness. You feel depressed, anxious, or hopeless. Get help right away if: You have thoughts about hurting yourself or others. Get help right away if you feel like you may hurt yourself or others, or have thoughts about taking your own life. Go to your nearest emergency room or: Call 911. Call the [facility] at [phone] or 988. This is open 24 hours a day. Text the Crisis Text Line at 302-871-4900. Summary Grief is the result of a major change or an absence of someone or something that you count on. Grief is a normal reaction to loss. The depth of grief and the period of recovery depend on the type of loss and your ability to adjust to the change and process your feelings. Processing grief requires patience and a willingness to accept your feelings and talk about your loss  with people who are supportive. It is important to find resources that work for you and to realize that people experience grief differently. There is not one grieving process that works for everyone in the same way. Be aware that when grief becomes extreme, it can lead to more severe issues like isolation, depression, anxiety, or suicidal thoughts. Talk with your health care provider if you have any of these issues. This information is not intended to replace advice given to you by your health care provider. Make sure you discuss any questions you have with your health care provider. Document Revised: 04/10/2021 Document Reviewed: 04/10/2021 Elsevier Patient Education  [place].    If you have been instructed to have an in-person evaluation today at a local Urgent Care facility, please use the link below. It will take you to a list of all of our available  [facility], including address, phone number and hours of operation. Please do not delay care.  [facility]  If you or a family member do not have a primary care provider, use the link below to schedule a visit and establish care. When you choose a [facility] primary care physician or advanced practice provider, you gain a long-term partner in health. Find a Primary Care Provider  Learn more about 's in-office and virtual care options: [facility]

## 2022-03-09 ENCOUNTER — Other Ambulatory Visit: Payer: Self-pay | Admitting: Family Medicine

## 2022-03-09 DIAGNOSIS — G8929 Other chronic pain: Secondary | ICD-10-CM

## 2022-03-09 MED ORDER — GABAPENTIN 300 MG PO CAPS: 300.0000 mg | ORAL_CAPSULE | Freq: Every day | ORAL | 0 refills | Status: DC

## 2022-03-09 NOTE — Telephone Encounter (Signed)
Last OV: 11/23/2021 Last Refill: 02/14/2022

## 2022-03-09 NOTE — Telephone Encounter (Signed)
I made pt an appt on 04-11-2022 w/[facility] to get med refill.

## 2022-03-09 NOTE — Addendum Note (Signed)
Addended by: Antonietta Barcelona D on: 03/09/2022 03:06 PM   Modules accepted: Orders

## 2022-03-30 ENCOUNTER — Encounter: Payer: Self-pay | Admitting: Gastroenterology

## 2022-04-06 NOTE — Patient Instructions (Signed)
Our records indicate that you are due for your annual mammogram/breast imaging. While there is no way to prevent breast cancer, early detection provides the best opportunity for curing it. For women over the age of 40, the American Cancer Society recommends a yearly clinical breast exam and a yearly mammogram. These practices have saved thousands of lives. We need your help to ensure that you are receiving optimal medical care. Please call the imaging location that has done you previous mammograms. Please remember to list us as your primary care. This helps make sure we receive a report and can update your chart.  Below is the contact information for several local breast imaging centers. You may call the location that works best for you, and they will be happy to assistance in making you an appointment. You do not need an order for a regular screening mammogram. However, if you are having any problems or concerns with you breast area, please let your primary care provider know, and appropriate orders will be placed. Please let our office know if you have any questions or concerns. Or if you need information for another imaging center not on this list or outside of the area. We are commented to working with you on your health care journey.   The mobile unit/bus ([facility]) - they come twice a month to our location.  These appointments can be made through our office or by call The Breast Center  [facility]  [address] [place], [place] [address] Phone [phone]  [facility]  [address]  [place], [place] [address] [phone]  [facility]  [address] [place], [place] [address] [phone]  [facility] [place]  [address] [place] [address] [phone]  [facility]  [address], [address] [place] [address] [phone]  [facility] [place]  [address] [place], [place] [address] [phone]  [facility] [address] [place], [place] [address] [phone]  [facility] [address] [place], [place] [address] [phone]  [facility] [address] [place] [address] [phone]     

## 2022-04-11 ENCOUNTER — Encounter: Payer: Self-pay | Admitting: Family Medicine

## 2022-04-11 ENCOUNTER — Ambulatory Visit: Payer: BC Managed Care – PPO | Admitting: Family Medicine

## 2022-04-11 VITALS — BP 135/89 | HR 93 | Temp 97.9°F | Ht 62.0 in | Wt 135.6 lb

## 2022-04-11 DIAGNOSIS — J449 Chronic obstructive pulmonary disease, unspecified: Secondary | ICD-10-CM

## 2022-04-11 DIAGNOSIS — Z720 Tobacco use: Secondary | ICD-10-CM

## 2022-04-11 DIAGNOSIS — M542 Cervicalgia: Secondary | ICD-10-CM | POA: Diagnosis not present

## 2022-04-11 DIAGNOSIS — G8929 Other chronic pain: Secondary | ICD-10-CM

## 2022-04-11 DIAGNOSIS — F4321 Adjustment disorder with depressed mood: Secondary | ICD-10-CM | POA: Diagnosis not present

## 2022-04-11 MED ORDER — ALBUTEROL SULFATE HFA 108 (90 BASE) MCG/ACT IN AERS: 1.0000 | INHALATION_SPRAY | Freq: Four times a day (QID) | RESPIRATORY_TRACT | 2 refills | Status: DC | PRN

## 2022-04-11 MED ORDER — SERTRALINE HCL 50 MG PO TABS: 50.0000 mg | ORAL_TABLET | Freq: Every day | ORAL | 0 refills | Status: DC

## 2022-04-11 MED ORDER — GABAPENTIN 300 MG PO CAPS: 300.0000 mg | ORAL_CAPSULE | Freq: Every day | ORAL | 3 refills | Status: DC

## 2022-04-11 MED ORDER — VARENICLINE TARTRATE 1 MG PO TABS: 1.0000 mg | ORAL_TABLET | Freq: Two times a day (BID) | ORAL | 3 refills | Status: DC

## 2022-04-11 MED ORDER — BUDESONIDE-FORMOTEROL FUMARATE 80-4.5 MCG/ACT IN AERO: 2.0000 | INHALATION_SPRAY | Freq: Two times a day (BID) | RESPIRATORY_TRACT | 12 refills | Status: DC

## 2022-04-11 MED ORDER — PRAZOSIN HCL 1 MG PO CAPS: 1.0000 mg | ORAL_CAPSULE | Freq: Every day | ORAL | 0 refills | Status: DC

## 2022-04-11 NOTE — Progress Notes (Signed)
Subjective: CC: Med refills PCP: Janora Norlander, DO GEX:BMWUX Holly Macdonald is a 56 y.o. female presenting to clinic today for:  1.  COPD/grief reaction/tobacco use disorder Patient reports need for refills on Symbicort and Ventolin.  She had stopped smoking but admits that she recently restarted smoking after the abrupt death of her daughter, who passed away from a drug overdose.  She has a lot of guilt and grief over the passing of her daughter as this is the second child that she has lost.  Her son was lost while in the [employer].  She has 1 living daughter left, who also has been suffering with some substance issues.  She reports feelings of guilt surrounding her grandchildren, whom reside with their father.  She notes that they have not been told the actual nature of their mother's death and she does not want to live to them.  She saw someone virtually back in June who placed her on Atarax that she was not sleeping well due to severe anxiety and panic surrounding the death of her daughter.  She has been previously treated by psychiatry for what they thought was a bipolar disorder but she denies any symptoms of mania.  She did not feel good on Lamictal nor Seroquel and does not wish to revisit those types of medications.  She is well supported by her spouse.  She wants to go back on Chantix so that she can stop smoking again.  Denies any shortness of breath, hemoptysis, unplanned weight loss or night sweats.   ROS: Per HPI  Allergies  Allergen Reactions   Morphine And Related Hives, Shortness Of Breath and Swelling   Codeine Nausea And Vomiting   Penicillins Hives   Past Medical History:  Diagnosis Date   Allergy    Anxiety    Arthritis    COPD (chronic obstructive pulmonary disease) (HCC)    Hyperlipidemia    Sleep apnea     Current Outpatient Medications:    acetaminophen (TYLENOL) 650 MG CR tablet, Take 650 mg by mouth 2 (two) times daily as needed for pain., Disp: , Rfl:     albuterol (VENTOLIN HFA) 108 (90 Base) MCG/ACT inhaler, Inhale 1-2 puffs into the lungs every 6 (six) hours as needed., Disp: 18 g, Rfl: 2   budesonide-formoterol (SYMBICORT) 80-4.5 MCG/ACT inhaler, Inhale 2 puffs into the lungs 2 (two) times daily., Disp: 1 each, Rfl: 5   fluticasone (FLONASE) 50 MCG/ACT nasal spray, Place 2 sprays into both nostrils daily., Disp: 16 g, Rfl: 0   Multiple Vitamin (MULTIVITAMIN) tablet, Take 1 tablet by mouth daily., Disp: , Rfl:    gabapentin (NEURONTIN) 300 MG capsule, Take 1 capsule (300 mg total) by mouth at bedtime. (Patient not taking: Reported on 04/11/2022), Disp: 30 capsule, Rfl: 0   varenicline (CHANTIX CONTINUING MONTH PAK) 1 MG tablet, Take 1 tablet (1 mg total) by mouth 2 (two) times daily. (Patient not taking: Reported on 04/11/2022), Disp: 60 tablet, Rfl: 3 Social History   Socioeconomic History   Marital status: Married    Spouse name: Not on file   Number of children: Not on file   Years of education: Not on file   Highest education level: Not on file  Occupational History   Not on file  Tobacco Use   Smoking status: Every Day    Packs/day: 0.50    Years: 40.00    Total pack years: 20.00    Types: Cigarettes   Smokeless tobacco: Never  Vaping Use   Vaping Use: Never used  Substance and Sexual Activity   Alcohol use: Yes    Alcohol/week: 1.0 standard drink of alcohol    Types: 1 Cans of beer per week   Drug use: No   Sexual activity: Never  Other Topics Concern   Not on file  Social History Narrative   Not on file   Social Determinants of Health   Financial Resource Strain: Not on file  Food Insecurity: Not on file  Transportation Needs: Not on file  Physical Activity: Not on file  Stress: Not on file  Social Connections: Not on file  Intimate Partner Violence: Not on file   Family History  Problem Relation Age of Onset   Cancer Mother    COPD Mother    Hearing loss Father    Early death Son    Mental illness Son     Depression Son    Cancer Maternal Aunt    COPD Paternal Uncle    Cancer Maternal Grandmother    COPD Maternal Grandmother    Varicose Veins Maternal Grandfather    Arthritis Paternal Grandmother    COPD Paternal Grandfather    Hearing loss Paternal Grandfather    Cancer Maternal Aunt    Colon cancer Neg Hx    Esophageal cancer Neg Hx    Rectal cancer Neg Hx    Stomach cancer Neg Hx     Objective: Office vital signs reviewed. BP 135/89   Pulse 93   Temp 97.9 F (36.6 C)   Ht '5\' 2"'$  (1.575 m)   Wt 135 lb 9.6 oz (61.5 kg)   SpO2 98%   BMI 24.80 kg/m   Physical Examination:  General: Awake, alert, nontoxic female, No acute distress HEENT: Sclera injected, tearful Cardio: regular rate and rhythm, S1S2 heard, no murmurs appreciated Pulm: clear to auscultation bilaterally, no wheezes, rhonchi or rales; normal work of breathing on room air Psych: Depressed, tearful     04/11/2022    1:49 PM 11/23/2021    8:04 AM 01/13/2021   10:30 AM  Depression screen PHQ 2/9  Decreased Interest 3 0 0  Down, Depressed, Hopeless 1 0 0  PHQ - 2 Score 4 0 0  Altered sleeping 3 3   Tired, decreased energy 3 3   Change in appetite 3 2   Feeling bad or failure about yourself  2 0   Trouble concentrating 3 0   Moving slowly or fidgety/restless 2 0   Suicidal thoughts 0 0   PHQ-9 Score 20 8   Difficult doing work/chores Somewhat difficult Not difficult at all       04/11/2022    1:50 PM 11/23/2021    8:05 AM  GAD 7 : Generalized Anxiety Score  Nervous, Anxious, on Edge 3 0  Control/stop worrying 1 1  Worry too much - different things 2 0  Trouble relaxing 3 0  Restless 2 0  Easily annoyed or irritable 3 0  Afraid - awful might happen 3 0  Total GAD 7 Score 17 1  Anxiety Difficulty Somewhat difficult Not difficult at all   Assessment/ Plan: 56 y.o. female   Grief reaction - Plan: prazosin (MINIPRESS) 1 MG capsule, sertraline (ZOLOFT) 50 MG tablet  Chronic obstructive pulmonary  disease, unspecified COPD type (HCC) - Plan: albuterol (VENTOLIN HFA) 108 (90 Base) MCG/ACT inhaler, budesonide-formoterol (SYMBICORT) 80-4.5 MCG/ACT inhaler  Chronic neck pain - Plan: gabapentin (NEURONTIN) 300 MG capsule  Tobacco use - Plan:  varenicline (CHANTIX CONTINUING MONTH PAK) 1 MG tablet  Start Minipress at bedtime for nightmares and rest.  Discussed this may have some impact on blood pressure so she is to watch those.  Start Zoloft daily.  Discussed that this may take up to 4 weeks before she has any palpable improvement.  Would highly recommend that she consider seeing a grief counselor as well.  I would like to see her back in the next 4 to 6 weeks, sooner if concerns arise  COPD is stable with Symbicort and Ventolin.  These have been renewed  Using gabapentin as needed neck pain.  Unfortunately has resumed use of tobacco but understandably so given the recent passing of her daughter.  Motivated to come back off of cigarettes so she requested Chantix.  Will need to monitor her mental health whilst on the Chantix as this may exacerbate depression or anxiety  No orders of the defined types were placed in this encounter.  No orders of the defined types were placed in this encounter.    Janora Norlander, DO [facility] [phone]

## 2022-04-24 ENCOUNTER — Encounter: Payer: Self-pay | Admitting: Family Medicine

## 2022-05-15 ENCOUNTER — Ambulatory Visit: Payer: BC Managed Care – PPO | Admitting: Family Medicine

## 2022-05-15 ENCOUNTER — Encounter: Payer: Self-pay | Admitting: Family Medicine

## 2022-05-15 VITALS — BP 131/79 | HR 87 | Temp 97.9°F | Ht 62.0 in | Wt 137.2 lb

## 2022-05-15 DIAGNOSIS — F5104 Psychophysiologic insomnia: Secondary | ICD-10-CM

## 2022-05-15 DIAGNOSIS — F419 Anxiety disorder, unspecified: Secondary | ICD-10-CM

## 2022-05-15 DIAGNOSIS — F4321 Adjustment disorder with depressed mood: Secondary | ICD-10-CM

## 2022-05-15 DIAGNOSIS — Z720 Tobacco use: Secondary | ICD-10-CM

## 2022-05-15 MED ORDER — ZALEPLON 5 MG PO CAPS: 5.0000 mg | ORAL_CAPSULE | Freq: Every evening | ORAL | 1 refills | Status: DC | PRN

## 2022-05-15 NOTE — Progress Notes (Signed)
Subjective: CC:f/u grief PCP: Janora Norlander, DO VEL:FYBOF Holly Macdonald is a 56 y.o. female presenting to clinic today for:  1.  Grief reaction with associated anxiety depression Patient was started on Zoloft last visit.  We also started prazosin for nightmares but a couple of weeks into the medication she was having some side effects (excessive stimulation) and it was discontinued. She reports that the zoloft has helped a lot. She is pleased with the dose.  Sleep continues to be an issue.  She has previously been treated with Atarax and Ambien and the Ambien caused too much excessive daytime sedation and the Atarax was just ineffective.   ROS: Per HPI  Allergies  Allergen Reactions   Morphine And Related Hives, Shortness Of Breath and Swelling   Codeine Nausea And Vomiting   Penicillins Hives   Past Medical History:  Diagnosis Date   Allergy    Anxiety    Arthritis    COPD (chronic obstructive pulmonary disease) (HCC)    Hyperlipidemia    Sleep apnea     Current Outpatient Medications:    acetaminophen (TYLENOL) 650 MG CR tablet, Take 650 mg by mouth 2 (two) times daily as needed for pain., Disp: , Rfl:    albuterol (VENTOLIN HFA) 108 (90 Base) MCG/ACT inhaler, Inhale 1-2 puffs into the lungs every 6 (six) hours as needed., Disp: 18 g, Rfl: 2   budesonide-formoterol (SYMBICORT) 80-4.5 MCG/ACT inhaler, Inhale 2 puffs into the lungs 2 (two) times daily., Disp: 1 each, Rfl: 12   fluticasone (FLONASE) 50 MCG/ACT nasal spray, Place 2 sprays into both nostrils daily., Disp: 16 g, Rfl: 0   gabapentin (NEURONTIN) 300 MG capsule, Take 1 capsule (300 mg total) by mouth at bedtime., Disp: 90 capsule, Rfl: 3   Multiple Vitamin (MULTIVITAMIN) tablet, Take 1 tablet by mouth daily., Disp: , Rfl:    prazosin (MINIPRESS) 1 MG capsule, Take 1 capsule (1 mg total) by mouth at bedtime. For sleep/ nightmares, Disp: 90 capsule, Rfl: 0   sertraline (ZOLOFT) 50 MG tablet, Take 1 tablet (50 mg  total) by mouth daily., Disp: 90 tablet, Rfl: 0   varenicline (CHANTIX CONTINUING MONTH PAK) 1 MG tablet, Take 1 tablet (1 mg total) by mouth 2 (two) times daily., Disp: 60 tablet, Rfl: 3 Social History   Socioeconomic History   Marital status: Married    Spouse name: Not on file   Number of children: Not on file   Years of education: Not on file   Highest education level: Not on file  Occupational History   Not on file  Tobacco Use   Smoking status: Every Day    Packs/day: 0.50    Years: 40.00    Total pack years: 20.00    Types: Cigarettes   Smokeless tobacco: Never  Vaping Use   Vaping Use: Never used  Substance and Sexual Activity   Alcohol use: Yes    Alcohol/week: 1.0 standard drink of alcohol    Types: 1 Cans of beer per week   Drug use: No   Sexual activity: Never  Other Topics Concern   Not on file  Social History Narrative   Not on file   Social Determinants of Health   Financial Resource Strain: Not on file  Food Insecurity: Not on file  Transportation Needs: Not on file  Physical Activity: Not on file  Stress: Not on file  Social Connections: Not on file  Intimate Partner Violence: Not on file   Family  History  Problem Relation Age of Onset   Cancer Mother    COPD Mother    Hearing loss Father    Early death Son    Mental illness Son    Depression Son    Cancer Maternal Aunt    COPD Paternal Uncle    Cancer Maternal Grandmother    COPD Maternal Grandmother    Varicose Veins Maternal Grandfather    Arthritis Paternal Grandmother    COPD Paternal Grandfather    Hearing loss Paternal Grandfather    Cancer Maternal Aunt    Colon cancer Neg Hx    Esophageal cancer Neg Hx    Rectal cancer Neg Hx    Stomach cancer Neg Hx     Objective: Office vital signs reviewed. BP 131/79   Pulse 87   Temp 97.9 F (36.6 C) (Temporal)   Ht '5\' 2"'$  (1.575 m)   Wt 137 lb 3.2 oz (62.2 kg)   SpO2 98%   BMI 25.09 kg/m   Physical Examination:  General:  Awake, alert, well-appearing female, No acute distress Psych: Mood stable, speech is normal, affect appropriate.  She seems to be in better spirits today.     05/15/2022    3:51 PM 04/11/2022    1:49 PM 11/23/2021    8:04 AM  Depression screen PHQ 2/9  Decreased Interest 1 3 0  Down, Depressed, Hopeless 1 1 0  PHQ - 2 Score 2 4 0  Altered sleeping '3 3 3  '$ Tired, decreased energy '3 3 3  '$ Change in appetite '2 3 2  '$ Feeling bad or failure about yourself  1 2 0  Trouble concentrating 3 3 0  Moving slowly or fidgety/restless 0 2 0  Suicidal thoughts 0 0 0  PHQ-9 Score '14 20 8  '$ Difficult doing work/chores Somewhat difficult Somewhat difficult Not difficult at all      05/15/2022    3:51 PM 04/11/2022    1:50 PM 11/23/2021    8:05 AM  GAD 7 : Generalized Anxiety Score  Nervous, Anxious, on Edge 3 3 0  Control/stop worrying '3 1 1  '$ Worry too much - different things 3 2 0  Trouble relaxing 3 3 0  Restless 3 2 0  Easily annoyed or irritable 3 3 0  Afraid - awful might happen 3 3 0  Total GAD 7 Score '21 17 1  '$ Anxiety Difficulty Somewhat difficult Somewhat difficult Not difficult at all    Assessment/ Plan: 56 y.o. female   Grief reaction  Anxiety  Tobacco use  Psychophysiological insomnia - Plan: zaleplon (SONATA) 5 MG capsule  Seems to be doing better from a depressive standpoint with the Zoloft but anxiety and depression scores still are high.  She did not want to adjust the Zoloft yet.  We will focus on sleep.  Discussed Ativan as potential alternative.  We will proceed with Sonata first and reevaluate in the next 4 to 6 weeks.  If not having any substantial improvement in symptoms or if anxiety is pretty significant still we may need to consider adding buspirone and/or transitioning to the benzodiazepine temporarily.  We discussed the controlled nature of Sonata and I encouraged her to use this only as needed.  We will plan for UDS and CSC at next visit if we intend on continuing this  medication  The national narcotic database reviewed and there were no red flags  No orders of the defined types were placed in this encounter.  No orders of  the defined types were placed in this encounter.    Janora Norlander, DO [place] [phone]

## 2022-05-15 NOTE — Patient Instructions (Signed)
Seborrheic Keratosis A seborrheic keratosis is a common, noncancerous (benign) skin growth. These growths are velvety, waxy, or rough spots that appear on the skin. They are often tan, brown, or black. The skin growths can be flat or raised and may be scaly. What are the causes? The cause of this condition is not known. What increases the risk? You are more likely to develop this condition if you: Have a family history of seborrheic keratosis. Are 50 years old or older. Are pregnant. Have had estrogen replacement therapy. What are the signs or symptoms? Symptoms of this condition include growths on the face, chest, shoulders, back, or other areas. These growths: Are usually painless, but may become irritated and itchy. Can be tan, yellow, brown, black, or other colors. Are slightly raised or have a flat surface. Are sometimes rough or wart-like in texture. Are often velvety or waxy on the surface. Are round or oval-shaped. Often occur in groups, but may occur as a single growth. How is this diagnosed? This condition is diagnosed with a medical history and physical exam. A sample of the growth may be tested (skin biopsy). You may also need to see a skin specialist (dermatologist). How is this treated? Treatment is not usually needed for this condition unless the growths are irritated or bleed often. You may also choose to have the growths removed if you do not like their appearance. Growth removal may include a procedure in which: Liquid nitrogen is applied to "freeze" off the growth (cryosurgery). This is the most common procedure. The growth is burned off with electricity (electrocautery). The growth is removed by scraping (curettage). Follow these instructions at home: Watch your growth or growths for any changes. Do not scratch or pick at the growth or growths. This can cause them to become irritated or infected. Contact a health care provider if: You suddenly have many new  growths. Your growth bleeds, itches, or hurts. Your growth suddenly becomes larger or changes color. Summary A seborrheic keratosis is a common, noncancerous skin growth. Treatment is not usually needed for this condition unless the growths are irritated or bleed often. Watch your growth or growths for any changes. Contact a health care provider if you suddenly have many new growths or your growth suddenly becomes larger or changes color. This information is not intended to replace advice given to you by your health care provider. Make sure you discuss any questions you have with your health care provider. Document Revised: 11/03/2021 Document Reviewed: 11/03/2021 Elsevier Patient Education  2023 Elsevier Inc.  

## 2022-06-26 ENCOUNTER — Ambulatory Visit: Payer: BC Managed Care – PPO | Admitting: Family Medicine

## 2022-08-01 ENCOUNTER — Other Ambulatory Visit: Payer: Self-pay | Admitting: Family Medicine

## 2022-08-01 DIAGNOSIS — Z720 Tobacco use: Secondary | ICD-10-CM

## 2022-08-23 ENCOUNTER — Telehealth: Payer: BC Managed Care – PPO | Admitting: Emergency Medicine

## 2022-08-23 DIAGNOSIS — Z20828 Contact with and (suspected) exposure to other viral communicable diseases: Secondary | ICD-10-CM

## 2022-08-23 DIAGNOSIS — J111 Influenza due to unidentified influenza virus with other respiratory manifestations: Secondary | ICD-10-CM

## 2022-08-23 MED ORDER — OSELTAMIVIR PHOSPHATE 75 MG PO CAPS: 75.0000 mg | ORAL_CAPSULE | Freq: Two times a day (BID) | ORAL | 0 refills | Status: DC

## 2022-08-23 NOTE — Progress Notes (Signed)
E visit for Flu like symptoms   We are sorry that you are not feeling well.  Here is how we plan to help! Based on what you have shared with me it looks like you may have a respiratory virus that may be influenza. You may just have an upper respiratory infection like rhinovirus but since you have a family member who lives with you who has the flu, I'd like to treat you for the flu.   Influenza or "the flu" is   an infection caused by a respiratory virus. The flu virus is highly contagious and persons who did not receive their yearly flu vaccination may "catch" the flu from close contact.  We have anti-viral medications to treat the viruses that cause this infection. They are not a "cure" and only shorten the course of the infection. These prescriptions are most effective when they are given within the first 2 days of "flu" symptoms. Antiviral medication are indicated if you have a high risk of complications from the flu. You should  also consider an antiviral medication if you are in close contact with someone who is at risk. These medications can help patients avoid complications from the flu  but have side effects that you should know. Possible side effects from Tamiflu or oseltamivir include nausea, vomiting, diarrhea, dizziness, headaches, eye redness, sleep problems or other respiratory symptoms. You should not take Tamiflu if you have an allergy to oseltamivir or any to the ingredients in Tamiflu.  Based upon your symptoms and potential risk factors I have prescribed Oseltamivir (Tamiflu).  It has been sent to your designated pharmacy.  You will take one 75 mg capsule orally twice a day for the next 5 days.  ANYONE WHO HAS FLU SYMPTOMS SHOULD: Stay home. The flu is highly contagious and going out or to work exposes others! Be sure to drink plenty of fluids. Water is fine as well as fruit juices, sodas and electrolyte beverages. You may want to stay away from caffeine or alcohol. If you are  nauseated, try taking small sips of liquids. How do you know if you are getting enough fluid? Your urine should be a pale yellow or almost colorless. Get rest. Taking a steamy shower or using a humidifier may help nasal congestion and ease sore throat pain. Using a saline nasal spray works much the same way. Cough drops, hard candies and sore throat lozenges may ease your cough. Line up a caregiver. Have someone check on you regularly.   GET HELP RIGHT AWAY IF: You cannot keep down liquids or your medications. You become short of breath Your fell like you are going to pass out or loose consciousness. Your symptoms persist after you have completed your treatment plan MAKE SURE YOU  Understand these instructions. Will watch your condition. Will get help right away if you are not doing well or get worse.  Your e-visit answers were reviewed by a board certified advanced clinical practitioner to complete your personal care plan.  Depending on the condition, your plan could have included both over the counter or prescription medications.  If there is a problem please reply  once you have received a response from your provider.  Your safety is important to [place].  If you have drug allergies check your prescription carefully.    You can use MyChart to ask questions about today's visit, request a non-urgent call back, or ask for a work or school excuse for 24 hours related to this e-Visit. If  it has been greater than 24 hours you will need to follow up with your provider, or enter a new e-Visit to address those concerns.  You will get an e-mail in the next two days asking about your experience.  I hope that your e-visit has been valuable and will speed your recovery. Thank you for using e-visits.  I have spent 5 minutes in review of e-visit questionnaire, review and updating patient chart, medical decision making and response to patient.   Willeen Cass, PhD, FNP-BC

## 2022-09-03 DIAGNOSIS — A09 Infectious gastroenteritis and colitis, unspecified: Secondary | ICD-10-CM

## 2022-09-03 HISTORY — DX: Infectious gastroenteritis and colitis, unspecified: A09

## 2022-09-28 DIAGNOSIS — H5203 Hypermetropia, bilateral: Secondary | ICD-10-CM | POA: Diagnosis not present

## 2022-12-04 ENCOUNTER — Telehealth: Payer: BC Managed Care – PPO | Admitting: Physician Assistant

## 2022-12-04 DIAGNOSIS — R0602 Shortness of breath: Secondary | ICD-10-CM

## 2022-12-04 NOTE — Progress Notes (Signed)
Because of severity of breathing issue and need for examination, I feel your condition warrants further evaluation and I recommend that you be seen in a face to face visit.   NOTE: There will be NO CHARGE for this eVisit   If you are having a true medical emergency please call 911.      For an urgent face to face visit, [place] has eight urgent care centers for your convenience:   NEW!! [facility] [place] [facility] [place] Get Driving Directions [id number] [address] [place], [place] [facility] [place] [facility] [place] Get Driving Directions [id number] [place], [place] [address]   [place] [facility]) Get Driving Directions [id number] [address], [place] [address]  [place] [facility] [place] ([place]) Get Driving Directions [id number] [address] [place],  [place]  [address]  [place] [facility] [place] [facility] at [address] Get Driving Directions  [id number] [phone] [address] [place],  [place] [address]   [facility] Get Driving Directions [id number] [place], [place], [place] [address]   [facility] Get Driving Directions  [id number] [address] [place], [place] [address]   [facility] [place] Get Driving Directions [id number] [address], [place], [place] [address]  Your MyChart E-visit questionnaire answers were reviewed by a board certified advanced clinical practitioner to complete your personal care plan based on your specific symptoms.  Thank you for using e-Visits.

## 2022-12-05 ENCOUNTER — Ambulatory Visit (INDEPENDENT_AMBULATORY_CARE_PROVIDER_SITE_OTHER): Payer: BC Managed Care – PPO

## 2022-12-05 ENCOUNTER — Ambulatory Visit: Payer: BC Managed Care – PPO | Admitting: Family Medicine

## 2022-12-05 ENCOUNTER — Encounter: Payer: Self-pay | Admitting: Family Medicine

## 2022-12-05 VITALS — BP 129/79 | HR 85 | Ht 62.0 in | Wt 140.0 lb

## 2022-12-05 DIAGNOSIS — J441 Chronic obstructive pulmonary disease with (acute) exacerbation: Secondary | ICD-10-CM | POA: Diagnosis not present

## 2022-12-05 DIAGNOSIS — J449 Chronic obstructive pulmonary disease, unspecified: Secondary | ICD-10-CM | POA: Diagnosis not present

## 2022-12-05 DIAGNOSIS — R0602 Shortness of breath: Secondary | ICD-10-CM | POA: Diagnosis not present

## 2022-12-05 MED ORDER — CEFDINIR 300 MG PO CAPS
300.0000 mg | ORAL_CAPSULE | Freq: Two times a day (BID) | ORAL | 0 refills | Status: DC
Start: 2022-12-05 — End: 2023-02-13

## 2022-12-05 MED ORDER — PREDNISONE 20 MG PO TABS: ORAL_TABLET | ORAL | 0 refills | Status: DC

## 2022-12-05 NOTE — Progress Notes (Signed)
BP 129/79   Pulse 85   Ht 5\' 2"  (1.575 m)   Wt 140 lb (63.5 kg)   SpO2 99%   BMI 25.61 kg/m    Subjective:   Patient ID: Holly Macdonald, female    DOB: February 17, 1966, 57 y.o.   MRN: [id number]  HPI: Holly Macdonald is a 57 y.o. female presenting on 12/05/2022 for Shortness of Breath (New. Getting worse.)   HPI Shortness of breath Patient is coming in today complaining of shortness of breath and says that it has been getting worse.  She says it started about a week ago and she knows that she has COPD but this has been worse and it has progressed over the past week.  She continues to use albuterol and her Symbicort and says they are just not helping.  She says she feels short of breath even just playing with her dog around the house or even walking to the next room and then has to sit and wait for minute till it passes.  She says she has been waking up short of breath at night as well sometimes.  She says she does still smoke although she is trying to reduce it that is down to 7 cigarettes a day instead of the 1 pack/day that she was smoking.  She is having a cough that is productive and some wheezing.  She denies any fevers or chills or sick contacts.  Relevant past medical, surgical, family and social history reviewed and updated as indicated. Interim medical history since our last visit reviewed. Allergies and medications reviewed and updated.  Review of Systems  Constitutional:  Negative for chills and fever.  HENT:  Positive for congestion. Negative for ear discharge, ear pain, postnasal drip, rhinorrhea, sinus pressure, sneezing and sore throat.   Eyes:  Negative for pain, redness and visual disturbance.  Respiratory:  Positive for cough, shortness of breath and wheezing. Negative for chest tightness.   Cardiovascular:  Negative for chest pain and leg swelling.  Genitourinary:  Negative for difficulty urinating and dysuria.  Musculoskeletal:  Negative for back pain, gait  problem and myalgias.  Skin:  Negative for rash.  Neurological:  Negative for light-headedness and headaches.  Psychiatric/Behavioral:  Negative for agitation and behavioral problems.   All other systems reviewed and are negative.   Per HPI unless specifically indicated above   Allergies as of 12/05/2022       Reactions   Morphine And Related Hives, Shortness Of Breath, Swelling   Codeine Nausea And Vomiting   Penicillins Hives        Medication List        Accurate as of December 05, 2022  3:46 PM. If you have any questions, ask your nurse or doctor.          STOP taking these medications    oseltamivir 75 MG capsule Commonly known as: Tamiflu Stopped by: Worthy Rancher, MD       TAKE these medications    acetaminophen 650 MG CR tablet Commonly known as: TYLENOL Take 650 mg by mouth 2 (two) times daily as needed for pain.   albuterol 108 (90 Base) MCG/ACT inhaler Commonly known as: VENTOLIN HFA Inhale 1-2 puffs into the lungs every 6 (six) hours as needed.   budesonide-formoterol 80-4.5 MCG/ACT inhaler Commonly known as: SYMBICORT Inhale 2 puffs into the lungs 2 (two) times daily.   cefdinir 300 MG capsule Commonly known as: OMNICEF Take 1 capsule (300 mg total) by  mouth 2 (two) times daily. 1 po BID Started by: Fransisca Kaufmann Shanyah Gattuso, MD   fluticasone 50 MCG/ACT nasal spray Commonly known as: FLONASE Place 2 sprays into both nostrils daily.   gabapentin 300 MG capsule Commonly known as: NEURONTIN Take 1 capsule (300 mg total) by mouth at bedtime.   multivitamin tablet Take 1 tablet by mouth daily.   prazosin 1 MG capsule Commonly known as: MINIPRESS Take 1 capsule (1 mg total) by mouth at bedtime. For sleep/ nightmares   predniSONE 20 MG tablet Commonly known as: DELTASONE 2 po at same time daily for 5 days Started by: Worthy Rancher, MD   sertraline 50 MG tablet Commonly known as: Zoloft Take 1 tablet (50 mg total) by mouth daily.    varenicline 1 MG tablet Commonly known as: Chantix Continuing Month Pak Take 1 tablet (1 mg total) by mouth 2 (two) times daily.   zaleplon 5 MG capsule Commonly known as: SONATA Take 1 capsule (5 mg total) by mouth at bedtime as needed for sleep.         Objective:   BP 129/79   Pulse 85   Ht 5\' 2"  (1.575 m)   Wt 140 lb (63.5 kg)   SpO2 99%   BMI 25.61 kg/m   Wt Readings from Last 3 Encounters:  12/05/22 140 lb (63.5 kg)  05/15/22 137 lb 3.2 oz (62.2 kg)  04/11/22 135 lb 9.6 oz (61.5 kg)    Physical Exam Vitals and nursing note reviewed.  Constitutional:      General: She is not in acute distress.    Appearance: She is well-developed. She is not diaphoretic.  HENT:     Nose: Mucosal edema present.     Right Sinus: No maxillary sinus tenderness or frontal sinus tenderness.     Left Sinus: No maxillary sinus tenderness or frontal sinus tenderness.     Mouth/Throat:     Pharynx: Uvula midline. No oropharyngeal exudate or posterior oropharyngeal erythema.     Tonsils: No tonsillar abscesses.  Eyes:     Conjunctiva/sclera: Conjunctivae normal.  Cardiovascular:     Rate and Rhythm: Normal rate and regular rhythm.     Heart sounds: Normal heart sounds. No murmur heard. Pulmonary:     Effort: Pulmonary effort is normal. No respiratory distress.     Breath sounds: Normal breath sounds. No wheezing, rhonchi or rales.  Musculoskeletal:        General: No tenderness. Normal range of motion.  Skin:    General: Skin is warm and dry.     Findings: No rash.  Neurological:     Mental Status: She is alert and oriented to person, place, and time.     Coordination: Coordination normal.  Psychiatric:        Behavior: Behavior normal.     Chest x-ray: COPD changes, no acute cardiopulmonary abnormality, no signs of pneumonia.  Assessment & Plan:   Problem List Items Addressed This Visit   None Visit Diagnoses     COPD exacerbation    -  Primary   Relevant Medications    predniSONE (DELTASONE) 20 MG tablet   cefdinir (OMNICEF) 300 MG capsule   Other Relevant Orders   DG Chest 2 View       Will treat like COPD exacerbation, continue with inhalers and will give a short course of prednisone and cefdinir. Follow up plan: Return if symptoms worsen or fail to improve.  Counseling provided for all of the vaccine  components Orders Placed This Encounter  Procedures   DG Chest Baxter, MD [facility] 12/05/2022, 3:46 PM

## 2022-12-09 ENCOUNTER — Other Ambulatory Visit: Payer: Self-pay | Admitting: Family Medicine

## 2022-12-09 DIAGNOSIS — Z720 Tobacco use: Secondary | ICD-10-CM

## 2023-01-04 ENCOUNTER — Other Ambulatory Visit: Payer: Self-pay | Admitting: Family Medicine

## 2023-01-04 DIAGNOSIS — Z720 Tobacco use: Secondary | ICD-10-CM

## 2023-02-08 ENCOUNTER — Other Ambulatory Visit: Payer: Self-pay | Admitting: Family Medicine

## 2023-02-08 DIAGNOSIS — Z720 Tobacco use: Secondary | ICD-10-CM

## 2023-02-13 ENCOUNTER — Encounter: Payer: Self-pay | Admitting: Family Medicine

## 2023-02-13 ENCOUNTER — Ambulatory Visit: Payer: BC Managed Care – PPO | Admitting: Family Medicine

## 2023-02-13 VITALS — BP 160/93 | HR 66 | Temp 98.3°F | Ht 62.0 in | Wt 138.0 lb

## 2023-02-13 DIAGNOSIS — F17210 Nicotine dependence, cigarettes, uncomplicated: Secondary | ICD-10-CM | POA: Diagnosis not present

## 2023-02-13 DIAGNOSIS — R0602 Shortness of breath: Secondary | ICD-10-CM

## 2023-02-13 DIAGNOSIS — Z720 Tobacco use: Secondary | ICD-10-CM

## 2023-02-13 DIAGNOSIS — F4321 Adjustment disorder with depressed mood: Secondary | ICD-10-CM | POA: Diagnosis not present

## 2023-02-13 DIAGNOSIS — J449 Chronic obstructive pulmonary disease, unspecified: Secondary | ICD-10-CM | POA: Diagnosis not present

## 2023-02-13 MED ORDER — SERTRALINE HCL 50 MG PO TABS: 50.0000 mg | ORAL_TABLET | Freq: Every day | ORAL | 3 refills | Status: DC

## 2023-02-13 MED ORDER — VARENICLINE TARTRATE 1 MG PO TABS: 1.0000 mg | ORAL_TABLET | Freq: Two times a day (BID) | ORAL | 3 refills | Status: DC

## 2023-02-13 MED ORDER — ALBUTEROL SULFATE HFA 108 (90 BASE) MCG/ACT IN AERS: 1.0000 | INHALATION_SPRAY | Freq: Four times a day (QID) | RESPIRATORY_TRACT | 2 refills | Status: DC | PRN

## 2023-02-13 MED ORDER — BREZTRI AEROSPHERE 160-9-4.8 MCG/ACT IN AERO: 2.0000 | INHALATION_SPRAY | Freq: Two times a day (BID) | RESPIRATORY_TRACT | 11 refills | Status: DC

## 2023-02-13 NOTE — Progress Notes (Signed)
Subjective: CC: Shortness of breath PCP: Raliegh Ip, DO GNF:AOZHY Holly Macdonald is a 57 y.o. female presenting to clinic today for:  1.  Shortness of breath Patient reports that she never fully recovered back to her baseline after recent COPD exacerbation in April.  She is compliant with her Symbicort 80 mg 2 puffs twice daily and has been utilizing her albuterol inhaler more.  She does feel better after the treatment that she received for COPD exacerbation but she never regained baseline breathing.  She notes that she is working from home now and is much more sedentary than she had been.  She has been actively trying to work on getting more physical activity and then she does feel better when she does.  She is smoking 5 cigarettes/day now.  Continues Chantix in efforts to get off of cigarettes.  Her mother died from lung cancer so this of course worries her.  She denies any hemoptysis, night sweats or unplanned weight loss.  If anything she has gained a little bit of weight from working from home   ROS: Per HPI  Allergies  Allergen Reactions   Morphine And Codeine Hives, Shortness Of Breath and Swelling   Codeine Nausea And Vomiting   Penicillins Hives   Past Medical History:  Diagnosis Date   Allergy    Anxiety    Arthritis    COPD (chronic obstructive pulmonary disease) (HCC)    Hyperlipidemia    Sleep apnea     Current Outpatient Medications:    acetaminophen (TYLENOL) 650 MG CR tablet, Take 650 mg by mouth 2 (two) times daily as needed for pain., Disp: , Rfl:    albuterol (VENTOLIN HFA) 108 (90 Base) MCG/ACT inhaler, Inhale 1-2 puffs into the lungs every 6 (six) hours as needed., Disp: 18 g, Rfl: 2   budesonide-formoterol (SYMBICORT) 80-4.5 MCG/ACT inhaler, Inhale 2 puffs into the lungs 2 (two) times daily., Disp: 1 each, Rfl: 12   cefdinir (OMNICEF) 300 MG capsule, Take 1 capsule (300 mg total) by mouth 2 (two) times daily. 1 po BID, Disp: 20 capsule, Rfl: 0    fluticasone (FLONASE) 50 MCG/ACT nasal spray, Place 2 sprays into both nostrils daily., Disp: 16 g, Rfl: 0   gabapentin (NEURONTIN) 300 MG capsule, Take 1 capsule (300 mg total) by mouth at bedtime., Disp: 90 capsule, Rfl: 3   Multiple Vitamin (MULTIVITAMIN) tablet, Take 1 tablet by mouth daily., Disp: , Rfl:    sertraline (ZOLOFT) 50 MG tablet, Take 1 tablet (50 mg total) by mouth daily., Disp: 90 tablet, Rfl: 0   varenicline (CHANTIX) 1 MG tablet, Take 1 tablet by mouth twice daily, Disp: 60 tablet, Rfl: 3   zaleplon (SONATA) 5 MG capsule, Take 1 capsule (5 mg total) by mouth at bedtime as needed for sleep., Disp: 30 capsule, Rfl: 1 Social History   Socioeconomic History   Marital status: Married    Spouse name: Not on file   Number of children: Not on file   Years of education: Not on file   Highest education level: Not on file  Occupational History   Not on file  Tobacco Use   Smoking status: Every Day    Packs/day: 0.50    Years: 40.00    Additional pack years: 0.00    Total pack years: 20.00    Types: Cigarettes   Smokeless tobacco: Never  Vaping Use   Vaping Use: Never used  Substance and Sexual Activity   Alcohol use: Yes  Alcohol/week: 1.0 standard drink of alcohol    Types: 1 Cans of beer per week   Drug use: No   Sexual activity: Never  Other Topics Concern   Not on file  Social History Narrative   Not on file   Social Determinants of Health   Financial Resource Strain: Not on file  Food Insecurity: Not on file  Transportation Needs: Not on file  Physical Activity: Not on file  Stress: Not on file  Social Connections: Not on file  Intimate Partner Violence: Not on file   Family History  Problem Relation Age of Onset   Cancer Mother    COPD Mother    Hearing loss Father    Early death Son    Mental illness Son    Depression Son    Cancer Maternal Aunt    COPD Paternal Uncle    Cancer Maternal Grandmother    COPD Maternal Grandmother    Varicose  Veins Maternal Grandfather    Arthritis Paternal Grandmother    COPD Paternal Grandfather    Hearing loss Paternal Grandfather    Cancer Maternal Aunt    Colon cancer Neg Hx    Esophageal cancer Neg Hx    Rectal cancer Neg Hx    Stomach cancer Neg Hx     Objective: Office vital signs reviewed. BP (!) 146/79   Pulse 66   Temp 98.3 F (36.8 C)   Ht 5\' 2"  (1.575 m)   Wt 138 lb (62.6 kg)   SpO2 98%   BMI 25.24 kg/m   Physical Examination:  General: Awake, alert, nontoxic female, No acute distress HEENT: sclera white, MMM Cardio: regular rate and rhythm, S1S2 heard, no murmurs appreciated Pulm: Globally decreased breath sounds but no appreciable wheezes, rhonchi or rales.  No coughing on exam.  No observed dyspnea with exertion. Extremities: No edema  Assessment/ Plan: 57 y.o. female   Shortness of breath - Plan: CT Chest Wo Contrast, CANCELED: CT Chest Wo Contrast  Tobacco use - Plan: varenicline (CHANTIX) 1 MG tablet, CT Chest Wo Contrast, CANCELED: CT Chest Wo Contrast  Chronic obstructive pulmonary disease, unspecified COPD type (HCC) - Plan: albuterol (VENTOLIN HFA) 108 (90 Base) MCG/ACT inhaler, Budeson-Glycopyrrol-Formoterol (BREZTRI AEROSPHERE) 160-9-4.8 MCG/ACT AERO  Grief reaction - Plan: sertraline (ZOLOFT) 50 MG tablet  CT chest ordered.  I reviewed her chest x-ray which was unrevealing back in April.  Unfortunately she does have risk of smoking for pulmonary malignancy.  I suspect however that she more likely has uncontrolled emphysema and I have switched her to triple therapy with Breztri.  Sample and coupon card provided and Rx sent to pharmacy.  Will plan to refer to pulmonology pending response.  Recommended close follow-up in 1 month for reevaluation, sooner if concerns arise.  No orders of the defined types were placed in this encounter.  No orders of the defined types were placed in this encounter.    Raliegh Ip, DO [facility] [phone]

## 2023-02-15 ENCOUNTER — Other Ambulatory Visit: Payer: Self-pay | Admitting: Family Medicine

## 2023-02-15 DIAGNOSIS — Z1231 Encounter for screening mammogram for malignant neoplasm of breast: Secondary | ICD-10-CM

## 2023-02-20 ENCOUNTER — Other Ambulatory Visit: Payer: Self-pay | Admitting: Family Medicine

## 2023-02-20 DIAGNOSIS — R0602 Shortness of breath: Secondary | ICD-10-CM

## 2023-02-20 DIAGNOSIS — J449 Chronic obstructive pulmonary disease, unspecified: Secondary | ICD-10-CM

## 2023-02-20 DIAGNOSIS — Z720 Tobacco use: Secondary | ICD-10-CM

## 2023-02-21 ENCOUNTER — Encounter (HOSPITAL_BASED_OUTPATIENT_CLINIC_OR_DEPARTMENT_OTHER): Payer: Self-pay

## 2023-02-22 ENCOUNTER — Ambulatory Visit: Payer: BC Managed Care – PPO | Admitting: Internal Medicine

## 2023-02-22 ENCOUNTER — Ambulatory Visit (HOSPITAL_BASED_OUTPATIENT_CLINIC_OR_DEPARTMENT_OTHER): Payer: BC Managed Care – PPO

## 2023-02-22 ENCOUNTER — Encounter: Payer: Self-pay | Admitting: Internal Medicine

## 2023-02-22 VITALS — BP 97/64 | HR 81 | Ht 62.0 in | Wt 138.6 lb

## 2023-02-22 DIAGNOSIS — F17210 Nicotine dependence, cigarettes, uncomplicated: Secondary | ICD-10-CM | POA: Insufficient documentation

## 2023-02-22 DIAGNOSIS — J449 Chronic obstructive pulmonary disease, unspecified: Secondary | ICD-10-CM | POA: Diagnosis not present

## 2023-02-22 MED ORDER — FAMOTIDINE 20 MG PO TABS: ORAL_TABLET | ORAL | 11 refills | Status: DC

## 2023-02-22 MED ORDER — PANTOPRAZOLE SODIUM 40 MG PO TBEC: 40.0000 mg | DELAYED_RELEASE_TABLET | Freq: Every day | ORAL | 2 refills | Status: DC

## 2023-02-22 NOTE — Patient Instructions (Addendum)
My office will be contacting you by phone for referral for lung cancer screening  [phone] - if you don't hear back from my office within one week please call us back or notify us thru MyChart and we'll address it right away.   Plan A = Automatic = Always=   Breztri Take 2 puffs first thing in am and then another 2 puffs about 12 hours later.    Work on inhaler technique:  relax and gently blow all the way out then take a nice smooth full deep breath back in, triggering the inhaler at same time you start breathing in.  Hold breath in for at least  5 seconds if you can. Blow out [employer]  thru nose. Rinse and gargle with water when done.  If mouth or throat bother you at all,  try brushing teeth/gums/tongue with arm and hammer toothpaste/ make a slurry and gargle and spit out.  >>>  Remember how golfers warm up by taking practice swings - do this with an empty inhaler       Plan B = Backup (to supplement plan A, not to replace it) Only use your albuterol inhaler as a rescue medication to be used if you can't catch your breath by resting or doing a relaxed purse lip breathing pattern.  - The less you use it, the better it will work when you need it. - Ok to use the inhaler up to 2 puffs  every 4 hours if you must but call for appointment if use goes up over your usual need - Don't leave home without it !!  (think of it like starter fluid  for your car)     Re SABA :  I spent extra time with pt today reviewing appropriate use of albuterol for prn use on exertion with the following points: 1) saba is for relief of sob that does not improve by walking a slower pace or resting but rather if the pt does not improve after trying this first. 2) If the pt is convinced, as many are, that saba helps recover from activity faster then it's easy to tell if this is the case by re-challenging : ie stop, take the inhaler, then p 5 minutes try the exact same activity (intensity of workload) that just caused the  symptoms and see if they are substantially diminished or not after saba 3) if there is an activity that reproducibly causes the symptoms, try the saba 15 min before the activity on alternate days   If in fact the saba really does help, then fine to continue to use it prn but advised may need to look closer at the maintenance regimen being used to achieve better control of airways disease with exertion.   Pantoprazole (protonix) 40 mg   Take  30-60 min before first meal of the day and Pepcid (famotidine)  20 mg after supper until return to office - this is the best way to tell whether stomach acid is contributing to your problem.    GERD (REFLUX)  is an extremely common cause of respiratory symptoms just like yours , many times with no obvious heartburn at all.    It can be treated with medication, but also with lifestyle changes including elevation of the head of your bed (ideally with 6 -8inch blocks under the headboard of your bed),  Smoking cessation, avoidance of late meals, excessive alcohol, and avoid fatty foods, chocolate, peppermint, colas, red wine, and acidic juices such as orange juice.  NO MINT OR MENTHOL PRODUCTS SO NO COUGH DROPS  USE SUGARLESS CANDY INSTEAD (Jolley ranchers or Stover's or Life Savers) or even ice chips will also do - the key is to swallow to prevent all throat clearing. NO OIL BASED VITAMINS - use powdered substitutes.  Avoid fish oil when coughing.    Please remember to go to the lab department   for your tests - we will call you with the results when they are available.  We will schedule PFTs next available       Please schedule a follow up office visit in 6 weeks, call sooner if needed

## 2023-02-22 NOTE — Progress Notes (Signed)
Holly Macdonald, female    DOB: 12-Sep-1965    MRN: [id number]   Brief patient profile:  57  yowf  active smoker with onset of rhinitis fall initially then year round then  coughing/ wheezing rx rx with prn saba then symbicort around 2021 referred to pulmonary clinic in [place]  02/22/2023 by Holly Macdonald  for copd eval.      History of Present Illness  02/22/2023  Pulmonary/ 1st office eval/ Holly Macdonald / [place] [facility]  Chief Complaint  Patient presents with   Consult    Pt consult for SOB during ADLs; pt was told by PCP years prior she had COPD.   Dyspnea:  shower worst / also bending over  Can walk 4-5 min walking faster than others / better on pred, worse when off  Cough: wakes her up sev times per week > clear mucus min vol Sleep: bed is flat / less prone x 2 pillows  SABA use: not using since breztri and also taking symbicort  02: none  Lung cancer screen: not done yet Overt intermittent HB rx otcs  No obvious day to day or daytime pattern/variability or assoc  purulent sputum or mucus plugs or hemoptysis or cp or chest tightness, subjective wheeze or overt sinus  symptoms.     Also denies any obvious fluctuation of symptoms with weather or environmental changes or other aggravating or alleviating factors except as outlined above   No unusual exposure hx or h/o childhood pna/ asthma or knowledge of premature birth.  Current Allergies, Complete Past Medical History, Past Surgical History, Family History, and Social History were reviewed in [employer] record.  ROS  The following are not active complaints unless bolded Hoarseness, sore throat, dysphagia, dental problems, itching, sneezing,  nasal congestion or discharge of excess mucus or purulent secretions, ear ache,   fever, chills, sweats, unintended wt loss or wt gain, classically pleuritic or exertional cp,  orthopnea pnd or arm/hand swelling  or leg swelling, presyncope, palpitations,  abdominal pain, anorexia, nausea, vomiting, diarrhea  or change in bowel habits or change in bladder habits, change in stools or change in urine, dysuria, hematuria,  rash, arthralgias, visual complaints, headache, numbness, weakness or ataxia or problems with walking or coordination,  change in mood or  memory.            Outpatient Medications Prior to Visit  Medication Sig Dispense Refill   acetaminophen (TYLENOL) 650 MG CR tablet Take 650 mg by mouth 2 (two) times daily as needed for pain.     albuterol (VENTOLIN HFA) 108 (90 Base) MCG/ACT inhaler Inhale 1-2 puffs into the lungs every 6 (six) hours as needed. 18 g 2   Budeson-Glycopyrrol-Formoterol (BREZTRI AEROSPHERE) 160-9-4.8 MCG/ACT AERO Inhale 2 puffs into the lungs 2 (two) times daily. 10.7 g 11   budesonide-formoterol (SYMBICORT) 80-4.5 MCG/ACT inhaler Inhale 2 puffs into the lungs 2 (two) times daily.     fluticasone (FLONASE) 50 MCG/ACT nasal spray Place 2 sprays into both nostrils daily. 16 g 0   gabapentin (NEURONTIN) 300 MG capsule Take 1 capsule (300 mg total) by mouth at bedtime. 90 capsule 3   Multiple Vitamin (MULTIVITAMIN) tablet Take 1 tablet by mouth daily.     sertraline (ZOLOFT) 50 MG tablet Take 1 tablet (50 mg total) by mouth daily. 90 tablet 3   varenicline (CHANTIX) 1 MG tablet Take 1 tablet (1 mg total) by mouth 2 (two) times daily. 60 tablet 3   No  facility-administered medications prior to visit.    Past Medical History:  Diagnosis Date   Allergy    Anxiety    Arthritis    COPD (chronic obstructive pulmonary disease) (HCC)    Hyperlipidemia    Sleep apnea       Objective:     BP 97/64   Pulse 81   Ht 5\' 2"  (1.575 m)   Wt 138 lb 9.6 oz (62.9 kg)   SpO2 97%   BMI 25.35 kg/m   SpO2: 97 % RA  Pleasant amb wf nad   HEENT : Oropharynx  clear   Nasal turbinates nl    NECK :  without  apparent JVD/ palpable Nodes/TM    LUNGS: no acc muscle use,  Min barrel  contour chest wall with bilateral   slightly decreased bs s audible wheeze and  without cough on insp or exp maneuvers and min  Hyperresonant  to  percussion bilaterally    CV:  RRR  no s3 or murmur or increase in P2, and no edema   ABD:  soft and nontender with pos end  insp Hoover's  in the supine position.  No bruits or organomegaly appreciated   MS:  Nl gait/ ext warm without deformities Or obvious joint restrictions  calf tenderness, cyanosis or clubbing     SKIN: warm and dry without lesions    NEURO:  alert, approp, nl sensorium with  no motor or cerebellar deficits apparent.         I personally reviewed images and agree with radiology impression as follows:  CXR:   PA and Lat  12/05/22 COPD. No active cardiopulmonary disease.  My review: mod hyperinflation     Assessment   COPD GOLD E Active smoker - 02/22/2023  After extensive coaching inhaler device,  effectiveness =    75% > continue breztri and approp saba  Allergy screen 02/22/2023 >  Eos 0. /  Alpha one   DDX of  difficult airways management almost all start with A and  include Adherence, Ace Inhibitors, Acid Reflux, Active Sinus Disease, Alpha 1 Antitripsin deficiency, Anxiety masquerading as Airways dz,  ABPA,  Allergy(esp in young), Aspiration (esp in elderly), Adverse effects of meds,  Active smoking or vaping, A bunch of PE's (a small clot burden can't cause this syndrome unless there is already severe underlying pulm or vascular dz with poor reserve) plus two Bs  = Bronchiectasis and Beta blocker use..and one C= CHF  Adherence is always the initial "prime suspect" and is a multilayered concern that requires a "trust but verify" approach in every patient - starting with knowing how to use medications, especially inhalers, correctly, keeping up with refills and understanding the fundamental difference between maintenance and prns vs those medications only taken for a very short course and then stopped and not refilled.  - note taking symbicort and breztri/  explained reduncancy >>> d/c symbicort - see hfa teaching  - return with all meds in hand using a trust but verify approach to confirm accurate Medication  Reconciliation The principal here is that until we are certain that the  patients are doing what we've asked, it makes no sense to ask them to do more.   Active smoking top of the usual list of suspects  ? Allergy / asthma/abpa  >  check Eos rx high dose ICS = breztri  ? Alpha one AT def > send phenotype   ? Anxiety/depression  > usually at the bottom of  this list of usual suspects but   may interfere with ability to quit smoking and adherence and also interpretation of response or lack thereof to symptom management which can be quite subjective.   ? A bunch of PEs > send phenotype    ? Chf > send bnp   Cigarette smoker Low-dose CT lung cancer screening is recommended for patients who are 72-65 years of age with a 20+ pack-year history of smoking and who are currently smoking or quit <=15 years ago. No coughing up blood  No unintentional weight loss of > 15 pounds in the last 6 months - pt is eligible for scanning yearly until 15 y p quit    Counseled re importance of smoking cessation but did not meet time criteria for separate billing           Each maintenance medication was reviewed in detail including emphasizing most importantly the difference between maintenance and prns and under what circumstances the prns are to be triggered using an action plan format where appropriate.  Total time for H and P, chart review, counseling, reviewing HFA device(s) and generating customized AVS unique to this office visit / same day charting = 60 min new pt eval           Sandrea Hughs, MD 02/22/2023

## 2023-02-22 NOTE — Assessment & Plan Note (Addendum)
Active smoker - 02/22/2023  After extensive coaching inhaler device,  effectiveness =    75% > continue breztri and approp saba  Allergy screen 02/22/2023 >  Eos 0. /  Alpha one   DDX of  difficult airways management almost all start with A and  include Adherence, Ace Inhibitors, Acid Reflux, Active Sinus Disease, Alpha 1 Antitripsin deficiency, Anxiety masquerading as Airways dz,  ABPA,  Allergy(esp in young), Aspiration (esp in elderly), Adverse effects of meds,  Active smoking or vaping, A bunch of PE's (a small clot burden can't cause this syndrome unless there is already severe underlying pulm or vascular dz with poor reserve) plus two Bs  = Bronchiectasis and Beta blocker use..and one C= CHF  Adherence is always the initial "prime suspect" and is a multilayered concern that requires a "trust but verify" approach in every patient - starting with knowing how to use medications, especially inhalers, correctly, keeping up with refills and understanding the fundamental difference between maintenance and prns vs those medications only taken for a very short course and then stopped and not refilled.  - note taking symbicort and breztri/ explained reduncancy >>> d/c symbicort - see hfa teaching  - return with all meds in hand using a trust but verify approach to confirm accurate Medication  Reconciliation The principal here is that until we are certain that the  patients are doing what we've asked, it makes no sense to ask them to do more.   Active smoking top of the usual list of suspects  ? Allergy / asthma/abpa  >  check Eos rx high dose ICS = breztri  ? Alpha one AT def > send phenotype   ? Anxiety/depression  > usually at the bottom of this list of usual suspects but   may interfere with ability to quit smoking and adherence and also interpretation of response or lack thereof to symptom management which can be quite subjective.   ? A bunch of PEs > send phenotype    ? Chf > send bnp

## 2023-02-22 NOTE — Assessment & Plan Note (Addendum)
Low-dose CT lung cancer screening is recommended for patients who are 6-59 years of age with a 20+ pack-year history of smoking and who are currently smoking or quit <=15 years ago. No coughing up blood  No unintentional weight loss of > 15 pounds in the last 6 months - pt is eligible for scanning yearly until 15 y p quit    Counseled re importance of smoking cessation but did not meet time criteria for separate billing           Each maintenance medication was reviewed in detail including emphasizing most importantly the difference between maintenance and prns and under what circumstances the prns are to be triggered using an action plan format where appropriate.  Total time for H and P, chart review, counseling, reviewing HFA device(s) and generating customized AVS unique to this office visit / same day charting = 60 min new pt eval

## 2023-02-27 ENCOUNTER — Ambulatory Visit
Admission: RE | Admit: 2023-02-27 | Discharge: 2023-02-27 | Disposition: A | Discharge status: Home / Self Care | Payer: BC Managed Care – PPO | Source: Ambulatory Visit | Attending: Family Medicine | Admitting: Family Medicine

## 2023-02-27 DIAGNOSIS — Z1231 Encounter for screening mammogram for malignant neoplasm of breast: Secondary | ICD-10-CM

## 2023-02-27 LAB — CBC WITH DIFFERENTIAL/PLATELET
Basophils Absolute: 0.1 10*3/uL (ref 0.0–0.2)
Basos: 1 %
EOS (ABSOLUTE): 0.1 10*3/uL (ref 0.0–0.4)
Eos: 1 %
Hematocrit: 38.5 % (ref 34.0–46.6)
Hemoglobin: 13.1 g/dL (ref 11.1–15.9)
Immature Grans (Abs): 0 10*3/uL (ref 0.0–0.1)
Immature Granulocytes: 0 %
Lymphocytes Absolute: 2.8 10*3/uL (ref 0.7–3.1)
Lymphs: 32 %
MCH: 30.6 pg (ref 26.6–33.0)
MCHC: 34 g/dL (ref 31.5–35.7)
MCV: 90 fL (ref 79–97)
Monocytes Absolute: 0.6 10*3/uL (ref 0.1–0.9)
Monocytes: 7 %
Neutrophils Absolute: 5.2 10*3/uL (ref 1.4–7.0)
Neutrophils: 59 %
Platelets: 272 10*3/uL (ref 150–450)
RBC: 4.28 x10E6/uL (ref 3.77–5.28)
RDW: 13.3 % (ref 11.7–15.4)
WBC: 8.8 10*3/uL (ref 3.4–10.8)

## 2023-02-27 LAB — BASIC METABOLIC PANEL
BUN/Creatinine Ratio: 9 (ref 9–23)
BUN: 9 mg/dL (ref 6–24)
CO2: 21 mmol/L (ref 20–29)
Calcium: 9.8 mg/dL (ref 8.7–10.2)
Chloride: 103 mmol/L (ref 96–106)
Creatinine, Ser: 0.95 mg/dL (ref 0.57–1.00)
Glucose: 81 mg/dL (ref 70–99)
Potassium: 4.2 mmol/L (ref 3.5–5.2)
Sodium: 140 mmol/L (ref 134–144)
eGFR: 69 mL/min/{1.73_m2} (ref >59)

## 2023-02-27 LAB — BRAIN NATRIURETIC PEPTIDE: BNP: 13.2 pg/mL (ref 0.0–100.0)

## 2023-02-27 LAB — ALPHA-1-ANTITRYPSIN PHENOTYP: A-1 Antitrypsin: 161 mg/dL (ref 101–187)

## 2023-02-27 LAB — TSH: TSH: 3.15 u[IU]/mL (ref 0.450–4.500)

## 2023-02-27 LAB — D-DIMER, QUANTITATIVE: D-DIMER: 0.29 mg/L FEU (ref 0.00–0.49)

## 2023-02-28 ENCOUNTER — Encounter: Payer: Self-pay | Admitting: Internal Medicine

## 2023-03-01 NOTE — Telephone Encounter (Signed)
Clotrimazole troche 10 mg qid prn # 12  refill x 2   Should now be on symbicort 80 not breztri and if continues to have this problem on symb 80 will need to be trained on use of spacer so will need ov for this

## 2023-03-05 ENCOUNTER — Other Ambulatory Visit: Payer: Self-pay

## 2023-03-05 ENCOUNTER — Ambulatory Visit: Payer: BC Managed Care – PPO | Admitting: Nurse Practitioner

## 2023-03-05 ENCOUNTER — Encounter (HOSPITAL_COMMUNITY): Payer: Self-pay | Admitting: *Deleted

## 2023-03-05 ENCOUNTER — Emergency Department (HOSPITAL_COMMUNITY): Payer: BC Managed Care – PPO

## 2023-03-05 ENCOUNTER — Encounter: Payer: Self-pay | Admitting: Nurse Practitioner

## 2023-03-05 ENCOUNTER — Emergency Department (HOSPITAL_COMMUNITY)
Admission: EM | Admit: 2023-03-05 | Discharge: 2023-03-06 | Disposition: A | Discharge status: Home / Self Care | Payer: BC Managed Care – PPO | Attending: Emergency Medicine | Admitting: Emergency Medicine

## 2023-03-05 VITALS — Ht 62.0 in

## 2023-03-05 DIAGNOSIS — R1030 Lower abdominal pain, unspecified: Secondary | ICD-10-CM | POA: Diagnosis not present

## 2023-03-05 DIAGNOSIS — K921 Melena: Secondary | ICD-10-CM | POA: Diagnosis not present

## 2023-03-05 DIAGNOSIS — Z7951 Long term (current) use of inhaled steroids: Secondary | ICD-10-CM | POA: Diagnosis not present

## 2023-03-05 DIAGNOSIS — K529 Noninfective gastroenteritis and colitis, unspecified: Secondary | ICD-10-CM | POA: Diagnosis not present

## 2023-03-05 DIAGNOSIS — R932 Abnormal findings on diagnostic imaging of liver and biliary tract: Secondary | ICD-10-CM | POA: Diagnosis not present

## 2023-03-05 DIAGNOSIS — B370 Candidal stomatitis: Secondary | ICD-10-CM | POA: Insufficient documentation

## 2023-03-05 DIAGNOSIS — K625 Hemorrhage of anus and rectum: Secondary | ICD-10-CM

## 2023-03-05 DIAGNOSIS — D72829 Elevated white blood cell count, unspecified: Secondary | ICD-10-CM | POA: Insufficient documentation

## 2023-03-05 DIAGNOSIS — K5730 Diverticulosis of large intestine without perforation or abscess without bleeding: Secondary | ICD-10-CM | POA: Diagnosis not present

## 2023-03-05 DIAGNOSIS — J449 Chronic obstructive pulmonary disease, unspecified: Secondary | ICD-10-CM | POA: Insufficient documentation

## 2023-03-05 LAB — CBC WITH DIFFERENTIAL/PLATELET
Abs Immature Granulocytes: 0.05 10*3/uL (ref 0.00–0.07)
Basophils Absolute: 0 10*3/uL (ref 0.0–0.1)
Basophils Relative: 0 %
Eosinophils Absolute: 0 10*3/uL (ref 0.0–0.5)
Eosinophils Relative: 0 %
HCT: 43 % (ref 36.0–46.0)
Hemoglobin: 14.4 g/dL (ref 12.0–15.0)
Immature Granulocytes: 0 %
Lymphocytes Relative: 14 %
Lymphs Abs: 1.9 10*3/uL (ref 0.7–4.0)
MCH: 30.5 pg (ref 26.0–34.0)
MCHC: 33.5 g/dL (ref 30.0–36.0)
MCV: 91.1 fL (ref 80.0–100.0)
Monocytes Absolute: 0.7 10*3/uL (ref 0.1–1.0)
Monocytes Relative: 5 %
Neutro Abs: 10.9 10*3/uL — ABNORMAL HIGH (ref 1.7–7.7)
Neutrophils Relative %: 81 %
Platelets: 306 10*3/uL (ref 150–400)
RBC: 4.72 MIL/uL (ref 3.87–5.11)
RDW: 13 % (ref 11.5–15.5)
WBC: 13.6 10*3/uL — ABNORMAL HIGH (ref 4.0–10.5)
nRBC: 0 % (ref 0.0–0.2)

## 2023-03-05 LAB — BASIC METABOLIC PANEL
Anion gap: 10 (ref 5–15)
BUN: 10 mg/dL (ref 6–20)
CO2: 24 mmol/L (ref 22–32)
Calcium: 9.4 mg/dL (ref 8.9–10.3)
Chloride: 103 mmol/L (ref 98–111)
Creatinine, Ser: 0.91 mg/dL (ref 0.44–1.00)
GFR, Estimated: >60 mL/min (ref >60)
Glucose, Bld: 114 mg/dL — ABNORMAL HIGH (ref 70–99)
Potassium: 3.7 mmol/L (ref 3.5–5.1)
Sodium: 137 mmol/L (ref 135–145)

## 2023-03-05 LAB — LIPASE, BLOOD: Lipase: 33 U/L (ref 11–51)

## 2023-03-05 LAB — HEPATIC FUNCTION PANEL
ALT: 21 U/L (ref 0–44)
AST: 16 U/L (ref 15–41)
Albumin: 4.1 g/dL (ref 3.5–5.0)
Alkaline Phosphatase: 93 U/L (ref 38–126)
Bilirubin, Direct: <0.1 mg/dL (ref 0.0–0.2)
Total Bilirubin: 0.4 mg/dL (ref 0.3–1.2)
Total Protein: 7.8 g/dL (ref 6.5–8.1)

## 2023-03-05 LAB — URINALYSIS, ROUTINE W REFLEX MICROSCOPIC
Bacteria, UA: NONE SEEN
Bilirubin Urine: NEGATIVE
Glucose, UA: NEGATIVE mg/dL
Ketones, ur: NEGATIVE mg/dL
Leukocytes,Ua: NEGATIVE
Nitrite: NEGATIVE
Protein, ur: 30 mg/dL — AB
Specific Gravity, Urine: 1.019 (ref 1.005–1.030)
pH: 6 (ref 5.0–8.0)

## 2023-03-05 MED ORDER — METRONIDAZOLE 500 MG PO TABS: 500.0000 mg | ORAL_TABLET | Freq: Two times a day (BID) | ORAL | 0 refills | Status: DC

## 2023-03-05 MED ORDER — CIPROFLOXACIN HCL 250 MG PO TABS
500.0000 mg | ORAL_TABLET | Freq: Once | ORAL | Status: AC
  Administered 2023-03-05: 500 mg via ORAL
  Filled 2023-03-05: qty 2

## 2023-03-05 MED ORDER — NYSTATIN 100000 UNIT/ML MT SUSP: 5.0000 mL | Freq: Four times a day (QID) | OROMUCOSAL | 0 refills | Status: AC

## 2023-03-05 MED ORDER — FENTANYL CITRATE PF 50 MCG/ML IJ SOSY
25.0000 ug | PREFILLED_SYRINGE | Freq: Once | INTRAMUSCULAR | Status: AC
  Administered 2023-03-05: 25 ug via INTRAVENOUS
  Filled 2023-03-05: qty 1

## 2023-03-05 MED ORDER — ONDANSETRON HCL 4 MG/2ML IJ SOLN
4.0000 mg | Freq: Once | INTRAMUSCULAR | Status: AC
  Administered 2023-03-05: 4 mg via INTRAVENOUS
  Filled 2023-03-05: qty 2

## 2023-03-05 MED ORDER — CIPROFLOXACIN HCL 500 MG PO TABS: 500.0000 mg | ORAL_TABLET | Freq: Two times a day (BID) | ORAL | 0 refills | Status: DC

## 2023-03-05 MED ORDER — METRONIDAZOLE 500 MG PO TABS
500.0000 mg | ORAL_TABLET | Freq: Once | ORAL | Status: AC
  Administered 2023-03-05: 500 mg via ORAL
  Filled 2023-03-05: qty 1

## 2023-03-05 MED ORDER — HYDROCODONE-ACETAMINOPHEN 5-325 MG PO TABS: 1.0000 | ORAL_TABLET | ORAL | 0 refills | Status: DC | PRN

## 2023-03-05 MED ORDER — IOHEXOL 300 MG/ML  SOLN
100.0000 mL | Freq: Once | INTRAMUSCULAR | Status: AC | PRN
  Administered 2023-03-05: 100 mL via INTRAVENOUS

## 2023-03-05 NOTE — Discharge Instructions (Signed)
Take the antibiotics as directed until they are finished.  Bland diet as tolerated.  Please call the GI provider listed to arrange follow-up appointment.  Return to the emergency department if you develop any new or worsening symptoms.

## 2023-03-05 NOTE — ED Provider Notes (Signed)
Holly Macdonald   CSN: [id number] Arrival date & time: 03/05/23  1545     History  Chief Complaint  Patient presents with   GI Bleeding    Holly Macdonald is a 57 y.o. female.  HPI      Holly Macdonald is a 57 y.o. female with past medical history of COPD, anxiety, hyperlipidemia who presents to the Emergency Department complaining of bright red blood per rectum intermittently since 5 AM this morning.  States that she woke up feeling the urge to defecate, became flushed and had pain through her abdomen.  Noticed bright red blood in the toilet and on the tissue.  Had some small amounts of stool as well.  Denies any history of aspirin or NSAIDs, no excessive alcohol use.  Currently having cramping pain through her lower abdomen.  Denies fever, chills, nausea, vomiting and weakness.   Home Medications Prior to Admission medications   Medication Sig Start Date End Date Taking? Authorizing Provider  acetaminophen (TYLENOL) 650 MG CR tablet Take 650 mg by mouth 2 (two) times daily as needed for pain.    [provider]  albuterol (VENTOLIN HFA) 108 (90 Base) MCG/ACT inhaler Inhale 1-2 puffs into the lungs every 6 (six) hours as needed. 02/13/23   Raliegh Ip, DO  Budeson-Glycopyrrol-Formoterol (BREZTRI AEROSPHERE) 160-9-4.8 MCG/ACT AERO Inhale 2 puffs into the lungs 2 (two) times daily. 02/13/23   Raliegh Ip, DO  budesonide-formoterol (SYMBICORT) 80-4.5 MCG/ACT inhaler Inhale 2 puffs into the lungs 2 (two) times daily.    [provider]  famotidine (PEPCID) 20 MG tablet One after supper 02/22/23   Nyoka Cowden, MD  fluticasone [facility]) 50 MCG/ACT nasal spray Place 2 sprays into both nostrils daily. 07/21/21   Freddy Finner, NP  gabapentin (NEURONTIN) 300 MG capsule Take 1 capsule (300 mg total) by mouth at bedtime. 04/11/22   Raliegh Ip, DO  Multiple Vitamin (MULTIVITAMIN) tablet  Take 1 tablet by mouth daily.    [provider]  nystatin (MYCOSTATIN) 100000 UNIT/ML suspension Take 5 mLs (500,000 Units total) by mouth 4 (four) times daily for 7 days. 03/05/23 03/12/23  St Vena [place], NP  pantoprazole (PROTONIX) 40 MG tablet Take 1 tablet (40 mg total) by mouth daily. Take 30-60 min before first meal of the day 02/22/23   Nyoka Cowden, MD  sertraline (ZOLOFT) 50 MG tablet Take 1 tablet (50 mg total) by mouth daily. 02/13/23   Raliegh Ip, DO  varenicline (CHANTIX) 1 MG tablet Take 1 tablet (1 mg total) by mouth 2 (two) times daily. 02/13/23   Raliegh Ip, DO      Allergies    Morphine and codeine, Codeine, and Penicillins    Review of Systems   Review of Systems  Constitutional:  Negative for appetite change, chills and fever.  Respiratory:  Negative for chest tightness and shortness of breath.   Gastrointestinal:  Positive for abdominal pain and anal bleeding. Negative for blood in stool and vomiting.  Genitourinary:  Negative for dysuria.  Neurological:  Negative for weakness.    Physical Exam Updated Vital Signs BP (!) 147/72   Pulse 64   Temp 98.6 F (37 C) (Oral)   Resp 18   Ht 5\' 2"  (1.575 m)   Wt 61.7 kg   SpO2 100%   BMI 24.87 kg/m  Physical Exam Vitals and nursing Macdonald reviewed.  Constitutional:  General: She is not in acute distress.    Appearance: Normal appearance. She is not toxic-appearing.     Comments: Patient uncomfortable appearing  HENT:     Mouth/Throat:     Mouth: Mucous membranes are moist.  Cardiovascular:     Rate and Rhythm: Normal rate and regular rhythm.     Pulses: Normal pulses.  Pulmonary:     Effort: Pulmonary effort is normal.  Abdominal:     General: There is no distension.     Palpations: Abdomen is soft.     Tenderness: There is abdominal tenderness. There is no right CVA tenderness or left CVA tenderness.     Comments: Generalized tenderness lower abdomen.  No guarding or  rebound tenderness.  No CVA tenderness.  Abdomen is soft  Musculoskeletal:        General: Normal range of motion.  Skin:    General: Skin is warm.     Capillary Refill: Capillary refill takes less than 2 seconds.  Neurological:     General: No focal deficit present.     Mental Status: She is alert.     Sensory: No sensory deficit.     Motor: No weakness.     ED Results / Procedures / Treatments   Labs (all labs ordered are listed, but only abnormal results are displayed) Labs Reviewed  CBC WITH DIFFERENTIAL/PLATELET - Abnormal; Notable for the following components:      Result Value   WBC 13.6 (*)    Neutro Abs 10.9 (*)    All other components within normal limits  BASIC METABOLIC PANEL - Abnormal; Notable for the following components:   Glucose, Bld 114 (*)    All other components within normal limits  URINALYSIS, ROUTINE W REFLEX MICROSCOPIC - Abnormal; Notable for the following components:   Hgb urine dipstick MODERATE (*)    Protein, ur 30 (*)    All other components within normal limits    EKG None  Radiology CT ABDOMEN PELVIS W CONTRAST  Result Date: 03/05/2023 CLINICAL DATA:  Left lower quadrant and right lower quadrant abdominal pain. Lower abdominal pain with hematochezia. Bright red blood in stool. EXAM: CT ABDOMEN AND PELVIS WITH CONTRAST TECHNIQUE: Multidetector CT imaging of the abdomen and pelvis was performed using the standard protocol following bolus administration of intravenous contrast. RADIATION DOSE REDUCTION: This exam was performed according to the departmental dose-optimization program which includes automated exposure control, adjustment of the mA and/or kV according to patient size and/or use of iterative reconstruction technique. CONTRAST:  OMNIPAQUE IOHEXOL 300 MG/ML  SOLN COMPARISON:  None Available. FINDINGS: Lower chest: No acute abnormality. Hepatobiliary: A subcentimeter hypodensity is noted in the left lobe of the liver, statistically  most likely representing a cyst or hemangioma. No biliary ductal dilatation. The gallbladder is without stones. Pancreas: Unremarkable. No pancreatic ductal dilatation or surrounding inflammatory changes. Spleen: Normal in size without focal abnormality. Adrenals/Urinary Tract: The adrenal glands are within normal limits. The kidneys enhance symmetrically. Renal cortical scarring is noted on the right. A subcentimeter hypodensity is noted in the left kidney which is too small to further characterize. No renal calculus or hydronephrosis. Bladder is unremarkable. Stomach/Bowel: The stomach is within normal limits. No bowel obstruction, free air, or pneumatosis. Scattered diverticula are present along the colon without evidence of diverticulitis. There is bowel wall thickening with surrounding fat stranding involving in the distal transverse colon and descending colon suggesting colitis. The appendix is not seen. Vascular/Lymphatic: Aortic atherosclerosis. No enlarged  abdominal or pelvic lymph nodes. Reproductive: Status post hysterectomy. No adnexal masses. Other: No abdominopelvic ascites. Hyperdense nodules are noted in the subcutaneous tissues in the anterior chest wall in the midline in the posterior right flank, possible sebaceous cysts. Musculoskeletal: Degenerative changes are noted in the thoracolumbar spine. No acute osseous abnormality. IMPRESSION: 1. Findings compatible with colitis involving the distal transverse colon and descending colon. 2. Diverticulosis without diverticulitis. 3. Appendix is not seen. 4. Aortic atherosclerosis. Electronically Signed   By: Thornell Sartorius M.D.   On: 03/05/2023 22:43    Procedures Procedures    Medications Ordered in ED Medications  ondansetron (ZOFRAN) injection 4 mg (has no administration in time range)  fentaNYL (SUBLIMAZE) injection 25 mcg (has no administration in time range)    ED Course/ Medical Decision Making/ A&P                              Medical Decision Making Patient here with lower abdominal pain and bright red blood per rectum.  No history of Crohn's or colitis.  Symptoms began today.  Fever or vomiting.  Differential would include but not limited to colitis, acute diverticulitis, Crohn's considered but felt less likely.  SBO and perforation also considered  On review of medical record, patient last colonoscopy in 2018 with evidence of 3 polyps the patient reports were benign  Amount and/or Complexity of Data Reviewed Labs: ordered.    Details: Labs interpreted by me, mild leukocytosis white count of 13,000, hemoglobin unremarkable, chemistries without derangement, lipase unremarkable urinalysis shows moderate blood without evidence of infection Radiology: ordered.    Details: CT abdomen and pelvis endings consistent with colitis involving the distal transverse and descending colon Discussion of management or test interpretation with external provider(s):   On recheck, patient resting comfortably.  Ambulated to the restroom.  Discussed CT findings with the patient.  She endorses penicillin allergy with hives, will give prescription for Cipro Flagyl and she is agreeable to close outpatient follow-up with GI.  Risk Prescription drug management.           Final Clinical Impression(s) / ED Diagnoses Final diagnoses:  Colitis    Rx / DC Orders ED Discharge Orders     None         Pauline Aus, PA-C 03/05/23 2351    Loetta Rough, MD 03/06/23 1256

## 2023-03-05 NOTE — Progress Notes (Signed)
Acute Office Visit  Subjective:     Patient ID: Holly Macdonald, female    DOB: 03/09/1966, 57 y.o.   MRN: [id number]  Chief Complaint  Patient presents with   Thrush   Rectal Bleeding    HPI  Bethia Hauswirth is a 57 yrs old female presents with her sister for an acute visit for abdominal and thrush. Pain started around midnight and was relieved after she had a BM. Reports Hematochezia  "started again this morning around 7 am I noticed bright red blood from the toilet after having a BM;  I have been to the bathroom 7 times since this morning and every time blood came out of anus, I can feel coming out". She had past history of hemorrhoids " last one was yrs ago after giving birth". She report fever and chill, with excruciating lower abdominal pain 8/10 stabbing pain. We discuss the need for her to go to ED to  for further evaluation, she agrees.   Trush: She reports that she was started on Breztri and develop thrush from it.   Review of Systems  Constitutional:  Positive for chills.  Eyes:  Negative for pain.  Respiratory: Negative.  Negative for cough, hemoptysis, shortness of breath and wheezing.   Cardiovascular: Negative.  Negative for chest pain and leg swelling.  Gastrointestinal:  Positive for abdominal pain and blood in stool. Negative for nausea and vomiting.  Genitourinary: Negative.  Negative for frequency and urgency.  Musculoskeletal: Negative.  Negative for back pain, falls and myalgias.  Skin: Negative.  Negative for itching and rash.  Neurological: Negative.  Negative for tingling, weakness and headaches.  Endo/Heme/Allergies: Negative.  Negative for environmental allergies and polydipsia.  Psychiatric/Behavioral:  Negative for substance abuse and suicidal ideas.    Negative unless indicated in HPI    Objective:    Ht 5\' 2"  (1.575 m)   BMI 25.35 kg/m  BP Readings from Last 3 Encounters:  02/22/23 97/64  02/13/23 (!) 160/93  12/05/22 129/79   Wt  Readings from Last 3 Encounters:  02/22/23 138 lb 9.6 oz (62.9 kg)  02/13/23 138 lb (62.6 kg)  12/05/22 140 lb (63.5 kg)      Physical Exam Vitals and nursing note reviewed.  Constitutional:      General: She is in acute distress.  HENT:     Head: Normocephalic and atraumatic.  Cardiovascular:     Rate and Rhythm: Normal rate.     Heart sounds: Normal heart sounds.  Pulmonary:     Effort: Pulmonary effort is normal.     Breath sounds: Normal breath sounds.  Abdominal:     General: Bowel sounds are normal.     Palpations: Abdomen is soft.     Tenderness: There is abdominal tenderness in the periumbilical area.  Skin:    General: Skin is warm and dry.     Findings: No rash.  Neurological:     General: No focal deficit present.     Mental Status: She is oriented to person, place, and time. Mental status is at baseline.  Psychiatric:        Mood and Affect: Mood normal.        Behavior: Behavior normal.        Thought Content: Thought content normal.     No results found for any visits on 03/05/23.      Assessment & Plan:  Thrush -     Nystatin; Take 5 mLs (500,000 Units total) by  mouth 4 (four) times daily for 7 days.  Dispense: 473 mL; Refill: 0  Bright red rectal bleeding  Hematochezia  Assessment Kashea Bartone is a 57 yrs old female  with excruciating pain Plan Bright red rectal bleeding/Hematochezia Client sister agree to take her to [facility] for further evaluation New Dx -Trush: Nystatin solution 5 ml switch and swallow QID for 7-days  Return for follow-up with PCP post  hospital d/c.  Arrie Aran Santa Lighter, DNP [facility] [address] [place], [place] [address] [phone]

## 2023-03-05 NOTE — ED Triage Notes (Signed)
Pt with blood in stool, bright red in color since 0500 this morning. + abd pain

## 2023-03-13 ENCOUNTER — Telehealth (INDEPENDENT_AMBULATORY_CARE_PROVIDER_SITE_OTHER): Payer: BC Managed Care – PPO | Admitting: Family Medicine

## 2023-03-13 ENCOUNTER — Encounter: Payer: Self-pay | Admitting: Family Medicine

## 2023-03-13 DIAGNOSIS — K625 Hemorrhage of anus and rectum: Secondary | ICD-10-CM | POA: Diagnosis not present

## 2023-03-13 DIAGNOSIS — J449 Chronic obstructive pulmonary disease, unspecified: Secondary | ICD-10-CM

## 2023-03-13 DIAGNOSIS — B3731 Acute candidiasis of vulva and vagina: Secondary | ICD-10-CM

## 2023-03-13 DIAGNOSIS — K529 Noninfective gastroenteritis and colitis, unspecified: Secondary | ICD-10-CM | POA: Diagnosis not present

## 2023-03-13 MED ORDER — BREZTRI AEROSPHERE 160-9-4.8 MCG/ACT IN AERO
2.0000 | INHALATION_SPRAY | Freq: Two times a day (BID) | RESPIRATORY_TRACT | 4 refills | Status: DC
Start: 2023-03-13 — End: 2024-06-10

## 2023-03-13 MED ORDER — FLUCONAZOLE 150 MG PO TABS
150.0000 mg | ORAL_TABLET | Freq: Once | ORAL | 0 refills | Status: AC
Start: 2023-03-13 — End: 2023-03-13

## 2023-03-13 NOTE — Progress Notes (Signed)
MyChart Video visit  Subjective: ZO:XWRUEA up COPD PCP: Raliegh Ip, DO VWU:[id number] Holly Macdonald is a 57 y.o. female. Patient provides verbal consent for consult held via video.  Due to COVID-19 pandemic this visit was conducted virtually. This visit type was conducted due to national recommendations for restrictions regarding the COVID-19 Pandemic (e.g. social distancing, sheltering in place) in an effort to limit this patient's exposure and mitigate transmission in our community. All issues noted in this document were discussed and addressed.  A physical exam was not performed with this format.   Location of patient: home Location of provider: [facility] Others present for call: none  1.  COPD Patient was started on trial of Breztri 1 month ago.  She notes that this really has been working well to improve her breathing.  However, she has been having thrush like symptoms.  She subsequently started only utilizing it 1 time per day and still feels like she is breathing better than the dual inhaler.  2.  Yeast vaginitis Patient with recent colitis diagnosis via CT scan.  She notes improvement in those symptoms and she has almost completed her oral antibiotic.  Placed on Cipro Flagyl for 7 days.  Unfortunately she now has developed a vaginal yeast infection and is asking for treatment for this.  Has not yet contacted her gastroenterologist but plans to do so   ROS: Per HPI  Allergies  Allergen Reactions   Morphine And Codeine Hives, Shortness Of Breath and Swelling   Codeine Nausea And Vomiting   Penicillins Hives   Past Medical History:  Diagnosis Date   Allergy    Anxiety    Arthritis    COPD (chronic obstructive pulmonary disease) (HCC)    Hyperlipidemia    Sleep apnea     Current Outpatient Medications:    fluconazole (DIFLUCAN) 150 MG tablet, Take 1 tablet (150 mg total) by mouth once for 1 dose. Repeat in 3 days if no resolution of symptoms, Disp: 2 tablet, Rfl: 0    albuterol (VENTOLIN HFA) 108 (90 Base) MCG/ACT inhaler, Inhale 1-2 puffs into the lungs every 6 (six) hours as needed., Disp: 18 g, Rfl: 2   Budeson-Glycopyrrol-Formoterol (BREZTRI AEROSPHERE) 160-9-4.8 MCG/ACT AERO, Inhale 2 puffs into the lungs 2 (two) times daily., Disp: 32.1 g, Rfl: 4   famotidine (PEPCID) 20 MG tablet, One after supper, Disp: 30 tablet, Rfl: 11   fluticasone (FLONASE) 50 MCG/ACT nasal spray, Place 2 sprays into both nostrils daily., Disp: 16 g, Rfl: 0   gabapentin (NEURONTIN) 300 MG capsule, Take 1 capsule (300 mg total) by mouth at bedtime., Disp: 90 capsule, Rfl: 3   Multiple Vitamin (MULTIVITAMIN) tablet, Take 1 tablet by mouth daily., Disp: , Rfl:    pantoprazole (PROTONIX) 40 MG tablet, Take 1 tablet (40 mg total) by mouth daily. Take 30-60 min before first meal of the day, Disp: 30 tablet, Rfl: 2   sertraline (ZOLOFT) 50 MG tablet, Take 1 tablet (50 mg total) by mouth daily., Disp: 90 tablet, Rfl: 3   varenicline (CHANTIX) 1 MG tablet, Take 1 tablet (1 mg total) by mouth 2 (two) times daily., Disp: 60 tablet, Rfl: 3  Gen: well appearing female, NAD HEENT: sclera white, MMM Pulm: normal WOB on room air  Assessment/ Plan: 57 y.o. female   Yeast vaginitis - Plan: fluconazole (DIFLUCAN) 150 MG tablet  Chronic obstructive pulmonary disease, unspecified COPD type (HCC) - Plan: Budeson-Glycopyrrol-Formoterol (BREZTRI AEROSPHERE) 160-9-4.8 MCG/ACT AERO  Colitis with rectal bleeding  Diflucan  sent for abx induced vaginitis  Responding well the Breztri.  Rx sent x1 year.  Discussed rinsing mouth after each use to reduce risk of oral irritation.   Encouraged to schedule f/u with GI.  Will cc Dr Lavon Paganini as [facility].  Anticipate may need repeat colonscopy +/- biopsy given recurrence of colitis. ?autoimmune?  Start time: 12:06pm End time: 12:24pm  Total time spent on patient care (including video visit/ documentation): 19 minutes  Tajh Livsey Hulen Skains, DO [facility] [phone]

## 2023-03-13 NOTE — Patient Instructions (Signed)
Colitis  Colitis is a condition in which the colon is inflamed. It can cause diarrhea, blood in the stool, and abdominal pain. Colitis can last a short time (be acute), or it may last a long time (become chronic). What are the causes? This condition may be caused by: Infections from viruses or bacteria. A reaction to medicine. Certain autoimmune diseases, such as Crohn's disease or ulcerative colitis. Radiation treatment. Decreased blood flow to the bowel (ischemia). What are the signs or symptoms? Symptoms of this condition include: Diarrhea, blood in the stool, or black, tarry stool. Pain in the joints or abdominal pain. Fever or fatigue. Vomiting. Weight loss. Bloating. Having fewer bowel movements than usual. A strong and sudden urge to have a bowel movement. Feeling like the bowel is not empty after a bowel movement. How is this diagnosed? This condition may be diagnosed based on a stool test and a blood test. You may also have other tests, such as: X-rays. CT scan. Colonoscopy. Endoscopy. Biopsy. How is this treated? Treatment for this condition depends on the cause. This condition may be treated with: Steps to rest the bowel, such as not eating or drinking for a period of time. Fluids that are given through an IV. Medicine for pain and diarrhea. Antibiotic medicines. Cortisone medicines. Surgery. Follow these instructions at home: Eating and drinking  Follow instructions from your health care provider about eating or drinking restrictions. Drink enough fluid to keep your urine pale yellow. Work with a dietitian to determine whether certain foods cause your condition to flare up. Avoid foods or drinks that cause flare-ups. Eat a well-balanced diet. General instructions If you were prescribed an antibiotic medicine, take it as told by your health care provider. Do not stop taking the antibiotic even if you start to feel better. Take over-the-counter and prescription  medicines only as told by your health care provider. Keep all follow-up visits. This is important. Contact a health care provider if: Your symptoms do not go away. You develop new symptoms. Get help right away if: You have a fever that does not go away with treatment. You develop chills. You have extreme weakness, fainting, or dehydration. You vomit repeatedly. You develop severe pain in your abdomen. You pass bloody or tarry stool. Summary Colitis is a condition in which the colon is inflamed. Colitis can last a short time (be acute), or it may last a long time (become chronic). Treatment for this condition depends on the cause and may include resting the bowel, taking medicines, or having surgery. If you were prescribed an antibiotic medicine, take it as told by your health care provider. Do not stop taking the antibiotic even if you start to feel better. Get help right away if you develop severe pain in your abdomen. Keep all follow-up visits. This is important. This information is not intended to replace advice given to you by your health care provider. Make sure you discuss any questions you have with your health care provider. Document Revised: 04/26/2020 Document Reviewed: 04/26/2020 Elsevier Patient Education  2024 [employer].

## 2023-03-15 ENCOUNTER — Encounter: Payer: Self-pay | Admitting: Gastroenterology

## 2023-04-08 NOTE — Progress Notes (Unsigned)
Holly Macdonald [place], female    DOB: 1966/07/30    MRN: [id number]   Brief patient profile:  59  yowf  active smoker with onset of rhinitis fall initially then year round then  coughing/ wheezing rx rx with prn saba then symbicort around 2021 referred to pulmonary clinic in [place]  02/22/2023 by Vanessa Kick  for copd eval.      History of Present Illness  02/22/2023  Pulmonary/ 1st office eval/ Zaydan Papesh / [place] [facility]  Chief Complaint  Patient presents with   Consult    Pt consult for SOB during ADLs; pt was told by PCP years prior she had COPD.   Dyspnea:  shower worst / also bending over  Can walk 4-5 min walking faster than others / better on pred, worse when off  Cough: wakes her up sev times per week > clear mucus min vol Sleep: bed is flat / less prone x 2 pillows  SABA use: not using since breztri and also taking symbicort  02: none  Lung cancer screen: not done yet Overt intermittent HB rx otcs Rec My office will be contacting you by phone for referral for lung cancer screening  [phone] - if you don't hear back from my office within one week please call us back or notify us thru MyChart and we'll address it right away.  Plan A = Automatic = Always=   Breztri Take 2 puffs first thing in am and then another 2 puffs about 12 hours later.   Work on inhaler technique:    Plan B = Backup (to supplement plan A, not to replace it) Only use your albuterol inhaler as a rescue medication Pantoprazole (protonix) 40 mg   Take  30-60 min before first meal of the day and Pepcid (famotidine)  20 mg after supper  GERD diet reviewed, bed blocks rec  Labs:  ok with Allergy screen 02/22/2023 >  Eos 0.1/  Alpha one MM  level 161   We will schedule PFTs next available > not done as of 04/09/2023   04/09/2023  f/u ov/[place] office/Anette Barra re: *** maint on ***  No chief complaint on file.   Dyspnea:  *** Cough: *** Sleeping: *** SABA use: *** 02: *** Covid status: *** Lung cancer  screening: ***   No obvious day to day or daytime variability or assoc excess/ purulent sputum or mucus plugs or hemoptysis or cp or chest tightness, subjective wheeze or overt sinus or hb symptoms.   *** without nocturnal  or early am exacerbation  of respiratory  c/o's or need for noct saba. Also denies any obvious fluctuation of symptoms with weather or environmental changes or other aggravating or alleviating factors except as outlined above   No unusual exposure hx or h/o childhood pna/ asthma or knowledge of premature birth.  Current Allergies, Complete Past Medical History, Past Surgical History, Family History, and Social History were reviewed in [employer] record.  ROS  The following are not active complaints unless bolded Hoarseness, sore throat, dysphagia, dental problems, itching, sneezing,  nasal congestion or discharge of excess mucus or purulent secretions, ear ache,   fever, chills, sweats, unintended wt loss or wt gain, classically pleuritic or exertional cp,  orthopnea pnd or arm/hand swelling  or leg swelling, presyncope, palpitations, abdominal pain, anorexia, nausea, vomiting, diarrhea  or change in bowel habits or change in bladder habits, change in stools or change in urine, dysuria, hematuria,  rash, arthralgias, visual complaints, headache, numbness,  weakness or ataxia or problems with walking or coordination,  change in mood or  memory.        No outpatient medications have been marked as taking for the 04/09/23 encounter (Appointment) with Nyoka Cowden, MD.           Past Medical History:  Diagnosis Date   Allergy    Anxiety    Arthritis    COPD (chronic obstructive pulmonary disease) (HCC)    Hyperlipidemia    Sleep apnea       Objective:      Wt Readings from Last 3 Encounters:  03/05/23 136 lb (61.7 kg)  02/22/23 138 lb 9.6 oz (62.9 kg)  02/13/23 138 lb (62.6 kg)      Vital signs reviewed  04/09/2023  - Note at rest 02  sats  ***% on ***   General appearance:    ***     Min bar***           Assessment

## 2023-04-09 ENCOUNTER — Encounter: Payer: Self-pay | Admitting: Internal Medicine

## 2023-04-09 ENCOUNTER — Ambulatory Visit: Payer: BC Managed Care – PPO | Admitting: Internal Medicine

## 2023-04-09 VITALS — BP 120/75 | HR 71 | Ht 62.0 in | Wt 137.0 lb

## 2023-04-09 DIAGNOSIS — F17210 Nicotine dependence, cigarettes, uncomplicated: Secondary | ICD-10-CM

## 2023-04-09 DIAGNOSIS — J449 Chronic obstructive pulmonary disease, unspecified: Secondary | ICD-10-CM

## 2023-04-09 NOTE — Assessment & Plan Note (Signed)
Active smoker/MM - 02/22/2023  After extensive coaching inhaler device,  effectiveness =    75% > continue breztri and approp saba  Allergy screen 02/22/2023 >  Eos 0.1/  Alpha one MM  level 161    Group D (now reclassified as E) in terms of symptom/risk and laba/lama/ICS  therefore appropriate rx at this point >>>  continue breztri and approp saba  Ok to drop the pm dose of breztri if doing great esp if having thrush issues with full dosing   - The proper method of use, as well as anticipated side effects, of a metered-dose inhaler were discussed and demonstrated to the patient using teach back method.  Emphasized use of arm and hammer p breztri

## 2023-04-09 NOTE — Assessment & Plan Note (Signed)
Presently on chantix which worked previously until had family tragedy.   F/u with PCP as planned and here q 6 m,sooner prn with pfts in meantime as planned for 05/08/23         Each maintenance medication was reviewed in detail including emphasizing most importantly the difference between maintenance and prns and under what circumstances the prns are to be triggered using an action plan format where appropriate.  Total time for H and P, chart review, counseling, reviewing hfa device(s) and generating customized AVS unique to this office visit / same day charting = 26 min

## 2023-04-09 NOTE — Patient Instructions (Signed)
Work on inhaler technique:  relax and gently blow all the way out then take a nice smooth full deep breath back in, triggering the inhaler at same time you start breathing in.  Hold breath in for at least  5 seconds if you can. Blow out breztri  thru nose. Rinse and gargle with water when done.  If mouth or throat bother you at all,  try brushing teeth/gums/tongue with arm and hammer toothpaste/ make a slurry and gargle and spit out.   Ok of leave off pm dose of Breztri if doing great   My office will be contacting you by phone for referral to lung cancer screening program  - if you don't hear back from my office within one week please call us back or notify us thru MyChart and we'll address it right away.   Please schedule a follow up visit in 6 months but call sooner if needed

## 2023-05-08 ENCOUNTER — Ambulatory Visit (INDEPENDENT_AMBULATORY_CARE_PROVIDER_SITE_OTHER): Payer: BC Managed Care – PPO | Admitting: Internal Medicine

## 2023-05-08 DIAGNOSIS — J449 Chronic obstructive pulmonary disease, unspecified: Secondary | ICD-10-CM

## 2023-05-08 LAB — PULMONARY FUNCTION TEST
DL/VA % pred: 49 %
DL/VA: 2.13 ml/min/mmHg/L
DLCO cor % pred: 53 %
DLCO cor: 9.9 ml/min/mmHg
DLCO unc % pred: 55 %
DLCO unc: 10.19 ml/min/mmHg
FEF 25-75 Post: 1.15 L/s
FEF 25-75 Pre: 0.95 L/s
FEF2575-%Change-Post: 21 %
FEF2575-%Pred-Post: 50 %
FEF2575-%Pred-Pre: 42 %
FEV1-%Change-Post: 5 %
FEV1-%Pred-Post: 82 %
FEV1-%Pred-Pre: 78 %
FEV1-Post: 1.93 L
FEV1-Pre: 1.83 L
FEV1FVC-%Change-Post: 3 %
FEV1FVC-%Pred-Pre: 81 %
FEV6-%Change-Post: 3 %
FEV6-%Pred-Post: 100 %
FEV6-%Pred-Pre: 96 %
FEV6-Post: 2.9 L
FEV6-Pre: 2.81 L
FEV6FVC-%Change-Post: 0 %
FEV6FVC-%Pred-Post: 102 %
FEV6FVC-%Pred-Pre: 102 %
FVC-%Change-Post: 2 %
FVC-%Pred-Post: 97 %
FVC-%Pred-Pre: 95 %
FVC-Post: 2.92 L
FVC-Pre: 2.85 L
Post FEV1/FVC ratio: 66 %
Post FEV6/FVC ratio: 99 %
Pre FEV1/FVC ratio: 64 %
Pre FEV6/FVC Ratio: 99 %
RV % pred: 135 %
RV: 2.43 L
TLC % pred: 117 %
TLC: 5.42 L

## 2023-05-08 NOTE — Progress Notes (Signed)
Full PFT performed today. °

## 2023-05-08 NOTE — Patient Instructions (Signed)
Full PFT performed today. °

## 2023-05-22 ENCOUNTER — Other Ambulatory Visit: Payer: Self-pay | Admitting: Internal Medicine

## 2023-05-28 ENCOUNTER — Encounter: Payer: Self-pay | Admitting: Gastroenterology

## 2023-05-28 ENCOUNTER — Other Ambulatory Visit (INDEPENDENT_AMBULATORY_CARE_PROVIDER_SITE_OTHER): Payer: BC Managed Care – PPO

## 2023-05-28 ENCOUNTER — Ambulatory Visit: Payer: BC Managed Care – PPO | Admitting: Gastroenterology

## 2023-05-28 ENCOUNTER — Telehealth: Payer: BC Managed Care – PPO | Admitting: Physician Assistant

## 2023-05-28 VITALS — BP 94/60 | HR 73 | Ht 62.0 in | Wt 135.0 lb

## 2023-05-28 DIAGNOSIS — R109 Unspecified abdominal pain: Secondary | ICD-10-CM | POA: Diagnosis not present

## 2023-05-28 DIAGNOSIS — J019 Acute sinusitis, unspecified: Secondary | ICD-10-CM | POA: Diagnosis not present

## 2023-05-28 DIAGNOSIS — Z8719 Personal history of other diseases of the digestive system: Secondary | ICD-10-CM

## 2023-05-28 DIAGNOSIS — Z86010 Personal history of colonic polyps: Secondary | ICD-10-CM

## 2023-05-28 DIAGNOSIS — H6501 Acute serous otitis media, right ear: Secondary | ICD-10-CM

## 2023-05-28 DIAGNOSIS — B9689 Other specified bacterial agents as the cause of diseases classified elsewhere: Secondary | ICD-10-CM | POA: Diagnosis not present

## 2023-05-28 LAB — CBC WITH DIFFERENTIAL/PLATELET
Basophils Absolute: 0 10*3/uL (ref 0.0–0.1)
Basophils Relative: 0.4 % (ref 0.0–3.0)
Eosinophils Absolute: 0 10*3/uL (ref 0.0–0.7)
Eosinophils Relative: 0.5 % (ref 0.0–5.0)
HCT: 40.9 % (ref 36.0–46.0)
Hemoglobin: 13.6 g/dL (ref 12.0–15.0)
Lymphocytes Relative: 44 % (ref 12.0–46.0)
Lymphs Abs: 2 10*3/uL (ref 0.7–4.0)
MCHC: 33.4 g/dL (ref 30.0–36.0)
MCV: 90 fl (ref 78.0–100.0)
Monocytes Absolute: 0.4 10*3/uL (ref 0.1–1.0)
Monocytes Relative: 8.6 % (ref 3.0–12.0)
Neutro Abs: 2.1 10*3/uL (ref 1.4–7.7)
Neutrophils Relative %: 46.5 % (ref 43.0–77.0)
Platelets: 216 10*3/uL (ref 150.0–400.0)
RBC: 4.54 Mil/uL (ref 3.87–5.11)
RDW: 14.4 % (ref 11.5–15.5)
WBC: 4.4 10*3/uL (ref 4.0–10.5)

## 2023-05-28 LAB — COMPREHENSIVE METABOLIC PANEL
ALT: 19 U/L (ref 0–35)
AST: 19 U/L (ref 0–37)
Albumin: 4 g/dL (ref 3.5–5.2)
Alkaline Phosphatase: 80 U/L (ref 39–117)
BUN: 18 mg/dL (ref 6–23)
CO2: 26 mEq/L (ref 19–32)
Calcium: 9 mg/dL (ref 8.4–10.5)
Chloride: 104 mEq/L (ref 96–112)
Creatinine, Ser: 1 mg/dL (ref 0.40–1.20)
GFR: 61.89 mL/min (ref >60.00)
Glucose, Bld: 100 mg/dL — ABNORMAL HIGH (ref 70–99)
Potassium: 3.7 mEq/L (ref 3.5–5.1)
Sodium: 138 mEq/L (ref 135–145)
Total Bilirubin: 0.3 mg/dL (ref 0.2–1.2)
Total Protein: 7.1 g/dL (ref 6.0–8.3)

## 2023-05-28 MED ORDER — NA SULFATE-K SULFATE-MG SULF 17.5-3.13-1.6 GM/177ML PO SOLN: ORAL | 0 refills | Status: DC

## 2023-05-28 MED ORDER — DOXYCYCLINE HYCLATE 100 MG PO TABS
100.0000 mg | ORAL_TABLET | Freq: Two times a day (BID) | ORAL | 0 refills | Status: DC
Start: 2023-05-28 — End: 2023-06-20

## 2023-05-28 NOTE — Progress Notes (Signed)
Chief Complaint: hospital follow up Primary GI MD: Dr. Lavon Paganini  HPI: 57 year old female history of COPD presents for evaluation of hospital follow-up.  Patient recently seen at [facility] emergency department July 2024 after an episode of severe abdominal cramping with bloody diarrhea, nausea, vomiting, fever, leukocytosis and CT scan showing colitis involving distal transverse colon and descending colon.  Diverticulosis without diverticulitis.  Due to penicillin allergy patient was put on Cipro/Flagyl.    Patient states she was doing well since her hospitalization up until 1 week ago when she had another bout which involved bloody diarrhea, severe abdominal cramping, and cold sweats.  She states she has had 4 bouts of this this year and prior to this year she had never had this before.  Denies any identifiable triggers.  States this most recent bout she does remember she had hot wings prior to having her episode.  Denies unintentional weight loss.  She states in between these episodes she has no difficulty.  Will have a bowel movement daily or every other day that is soft formed without blood.  PREVIOUS GI WORKUP   Last colonoscopy 03/2017 for screening - Two 4 to 7 mm polyps in rectum and proximal transverse colon.  (Tubular adenoma and hyperplastic) resected and retrieved. - One 2 mm hyperplastic polyp in descending colon - Diverticulosis in sigmoid colon, descending colon, transverse colon, ascending colon - Nonbleeding internal hemorrhoids - Repeat 5 years  Past Medical History:  Diagnosis Date   Allergy    Anxiety    Arthritis    COPD (chronic obstructive pulmonary disease) (HCC)    Hyperlipidemia    Sleep apnea     Past Surgical History:  Procedure Laterality Date   HYSTERECTOMY ABDOMINAL WITH SALPINGECTOMY     Tardy Ulna Palsy      Current Outpatient Medications  Medication Sig Dispense Refill   albuterol (VENTOLIN HFA) 108 (90 Base) MCG/ACT inhaler Inhale 1-2  puffs into the lungs every 6 (six) hours as needed. 18 g 2   Budeson-Glycopyrrol-Formoterol (BREZTRI AEROSPHERE) 160-9-4.8 MCG/ACT AERO Inhale 2 puffs into the lungs 2 (two) times daily. 32.1 g 4   doxycycline (VIBRA-TABS) 100 MG tablet Take 1 tablet (100 mg total) by mouth 2 (two) times daily. 20 tablet 0   famotidine (PEPCID) 20 MG tablet One after supper 30 tablet 11   gabapentin (NEURONTIN) 300 MG capsule Take 1 capsule (300 mg total) by mouth at bedtime. 90 capsule 3   Multiple Vitamin (MULTIVITAMIN) tablet Take 1 tablet by mouth daily.     pantoprazole (PROTONIX) 40 MG tablet TAKE 1 TABLET BY MOUTH ONCE DAILY 30 TO 60 MINUTES BEFORE FIRST MEAL OF THE DAY 30 tablet 1   sertraline (ZOLOFT) 50 MG tablet Take 1 tablet (50 mg total) by mouth daily. 90 tablet 3   varenicline (CHANTIX) 1 MG tablet Take 1 tablet (1 mg total) by mouth 2 (two) times daily. 60 tablet 3   No current facility-administered medications for this visit.    Allergies as of 05/28/2023 - Review Complete 05/28/2023  Allergen Reaction Noted   Morphine and codeine Hives, Shortness Of Breath, and Swelling 07/18/2012   Codeine Nausea And Vomiting 07/18/2012   Penicillins Hives 07/18/2012    Family History  Problem Relation Age of Onset   Cancer Mother    COPD Mother    Hearing loss Father    Cancer Maternal Aunt    Cancer Maternal Aunt    COPD Paternal Uncle    Cancer Maternal  Grandmother    COPD Maternal Grandmother    Varicose Veins Maternal Grandfather    Arthritis Paternal Grandmother    COPD Paternal Grandfather    Hearing loss Paternal Grandfather    Early death Son    Mental illness Son    Depression Son    Colon cancer Neg Hx    Esophageal cancer Neg Hx    Rectal cancer Neg Hx    Stomach cancer Neg Hx    Breast cancer Neg Hx     Social History   Socioeconomic History   Marital status: Married    Spouse name: Not on file   Number of children: Not on file   Years of education: Not on file    Highest education level: Not on file  Occupational History   Not on file  Tobacco Use   Smoking status: Every Day    Current packs/day: 1.50    Average packs/day: 1.5 packs/day for 51.0 years (76.5 ttl pk-yrs)    Types: Cigarettes    Start date: 06/02/1972   Smokeless tobacco: Never   Tobacco comments:    Pt currently smokes 7-13 cigarettes/day... Verified by [facility] 02/22/2023  Vaping Use   Vaping status: Never Used  Substance and Sexual Activity   Alcohol use: Yes    Alcohol/week: 1.0 standard drink of alcohol    Types: 1 Cans of beer per week   Drug use: No   Sexual activity: Never  Other Topics Concern   Not on file  Social History Narrative   Not on file   Social Determinants of Health   Financial Resource Strain: Not on file  Food Insecurity: Not on file  Transportation Needs: Not on file  Physical Activity: Not on file  Stress: Not on file  Social Connections: Not on file  Intimate Partner Violence: Not on file    Review of Systems:    Constitutional: No weight loss, fever, chills, weakness or fatigue HEENT: Eyes: No change in vision               Ears, Nose, Throat:  No change in hearing or congestion Skin: No rash or itching Cardiovascular: No chest pain, chest pressure or palpitations   Respiratory: No SOB or cough Gastrointestinal: See HPI and otherwise negative Genitourinary: No dysuria or change in urinary frequency Neurological: No headache, dizziness or syncope Musculoskeletal: No new muscle or joint pain Hematologic: No bleeding or bruising Psychiatric: No history of depression or anxiety    Physical Exam:  Vital signs: There were no vitals taken for this visit.  Constitutional: NAD, Well developed, Well nourished, alert and cooperative Head:  Normocephalic and atraumatic. Eyes:   PEERL, EOMI. No icterus. Conjunctiva pink. Respiratory: Respirations even and unlabored. Lungs clear to auscultation bilaterally.   No wheezes, crackles, or rhonchi.   Cardiovascular:  Regular rate and rhythm. No peripheral edema, cyanosis or pallor.  Gastrointestinal:  Soft, nondistended, nontender. No rebound or guarding. Normal bowel sounds. No appreciable masses or hepatomegaly. Rectal:  Not performed.  Msk:  Symmetrical without gross deformities. Without edema, no deformity or joint abnormality.  Neurologic:  Alert and  oriented x4;  grossly normal neurologically.  Skin:   Dry and intact without significant lesions or rashes. Psychiatric: Oriented to person, place and time. Demonstrates good judgement and reason without abnormal affect or behaviors.   RELEVANT LABS AND IMAGING: CBC    Component Value Date/Time   WBC 13.6 (H) 03/05/2023 1650   RBC 4.72 03/05/2023 1650  HGB 14.4 03/05/2023 1650   HGB 13.1 02/22/2023 1503   HCT 43.0 03/05/2023 1650   HCT 38.5 02/22/2023 1503   PLT 306 03/05/2023 1650   PLT 272 02/22/2023 1503   MCV 91.1 03/05/2023 1650   MCV 90 02/22/2023 1503   MCH 30.5 03/05/2023 1650   MCHC 33.5 03/05/2023 1650   RDW 13.0 03/05/2023 1650   RDW 13.3 02/22/2023 1503   LYMPHSABS 1.9 03/05/2023 1650   LYMPHSABS 2.8 02/22/2023 1503   MONOABS 0.7 03/05/2023 1650   EOSABS 0.0 03/05/2023 1650   EOSABS 0.1 02/22/2023 1503   BASOSABS 0.0 03/05/2023 1650   BASOSABS 0.1 02/22/2023 1503    CMP     Component Value Date/Time   NA 137 03/05/2023 1650   NA 140 02/22/2023 1503   K 3.7 03/05/2023 1650   CL 103 03/05/2023 1650   CO2 24 03/05/2023 1650   GLUCOSE 114 (H) 03/05/2023 1650   BUN 10 03/05/2023 1650   BUN 9 02/22/2023 1503   CREATININE 0.91 03/05/2023 1650   CALCIUM 9.4 03/05/2023 1650   PROT 7.8 03/05/2023 1650   PROT 6.9 11/23/2021 0844   ALBUMIN 4.1 03/05/2023 1650   ALBUMIN 4.4 11/23/2021 0844   AST 16 03/05/2023 1650   ALT 21 03/05/2023 1650   ALKPHOS 93 03/05/2023 1650   BILITOT 0.4 03/05/2023 1650   BILITOT 0.2 11/23/2021 0844   GFRNONAA >60 03/05/2023 1650   GFRAA 86 06/09/2019 1142      Assessment/Plan:   History of colitis Rectal bleeding Abdominal pain Four bouts of bloody diarrhea with associated abdominal pain and cold sweats over the last year with no previous history of this.  Recent hospitalization with CT scan showing colitis of distal transverse colon and descending colon and diverticulosis without diverticulitis.  Does well in between her flareups and no identifiable trigger. - CBC, CMP - Fecal calprotectin - Colonoscopy for further evaluation to rule out IBD - I thoroughly discussed the procedure with the patient (at bedside) to include nature of the procedure, alternatives, benefits, and risks (including but not limited to bleeding, infection, perforation, anesthesia/cardiac pulmonary complications).  Patient verbalized understanding and gave verbal consent to proceed with procedure.    Boone Master, PA-C [facility] 05/28/2023, 2:28 PM  Cc: Raliegh Ip, DO

## 2023-05-28 NOTE — Progress Notes (Signed)

## 2023-05-28 NOTE — Patient Instructions (Addendum)
Your provider has requested that you go to the basement level for lab work before leaving today. Press "B" on the elevator. The lab is located at the first door on the left as you exit the elevator.   You have been scheduled for a colonoscopy. Please follow written instructions given to you at your visit today.   Please pick up your prep supplies at the pharmacy within the next 1-3 days.  If you use inhalers (even only as needed), please bring them with you on the day of your procedure.  DO NOT TAKE 7 DAYS PRIOR TO TEST- Trulicity (dulaglutide) Ozempic, Wegovy (semaglutide) Mounjaro (tirzepatide) Bydureon Bcise (exanatide extended release)  DO NOT TAKE 1 DAY PRIOR TO YOUR TEST Rybelsus (semaglutide) Adlyxin (lixisenatide) Victoza (liraglutide) Byetta (exanatide) ___________________________________________________________________________  We have sent the following medications to your pharmacy for you to pick up at your convenience: Suprep  If your blood pressure at your visit was 140/90 or greater, please contact your primary care physician to follow up on this.  _______________________________________________________  If you are age 33 or older, your body mass index should be between 23-30. Your Body mass index is 24.69 kg/m. If this is out of the aforementioned range listed, please consider follow up with your Primary Care Provider.  If you are age 18 or younger, your body mass index should be between 19-25. Your Body mass index is 24.69 kg/m. If this is out of the aformentioned range listed, please consider follow up with your Primary Care Provider.   ________________________________________________________  The [place] [facility] providers would like to encourage you to use [facility] to communicate with providers for non-urgent requests or questions.  Due to long hold times on the telephone, sending your provider a message by [facility] may be a faster and more efficient way to get a  response.  Please allow 48 business hours for a response.  Please remember that this is for non-urgent requests.  _______________________________________________________ Due to recent changes in healthcare laws, you may see the results of your imaging and laboratory studies on MyChart before your provider has had a chance to review them.  We understand that in some cases there may be results that are confusing or concerning to you. Not all laboratory results come back in the same time frame and the provider may be waiting for multiple results in order to interpret others.  Please give [place] 48 hours in order for your provider to thoroughly review all the results before contacting the office for clarification of your results.    Thank you for entrusting me with your care and choosing [facility].  Bayley Leanna Sato, PA-C

## 2023-06-20 ENCOUNTER — Encounter: Payer: Self-pay | Admitting: Gastroenterology

## 2023-06-20 ENCOUNTER — Ambulatory Visit: Payer: BC Managed Care – PPO | Admitting: Gastroenterology

## 2023-06-20 VITALS — BP 111/59 | HR 82 | Temp 96.8°F | Resp 16 | Ht 62.0 in | Wt 135.0 lb

## 2023-06-20 DIAGNOSIS — Z8719 Personal history of other diseases of the digestive system: Secondary | ICD-10-CM

## 2023-06-20 DIAGNOSIS — K621 Rectal polyp: Secondary | ICD-10-CM | POA: Diagnosis not present

## 2023-06-20 DIAGNOSIS — D122 Benign neoplasm of ascending colon: Secondary | ICD-10-CM

## 2023-06-20 DIAGNOSIS — K635 Polyp of colon: Secondary | ICD-10-CM

## 2023-06-20 DIAGNOSIS — K922 Gastrointestinal hemorrhage, unspecified: Secondary | ICD-10-CM | POA: Diagnosis not present

## 2023-06-20 DIAGNOSIS — D120 Benign neoplasm of cecum: Secondary | ICD-10-CM

## 2023-06-20 DIAGNOSIS — R933 Abnormal findings on diagnostic imaging of other parts of digestive tract: Secondary | ICD-10-CM | POA: Diagnosis not present

## 2023-06-20 MED ORDER — SODIUM CHLORIDE 0.9 % IV SOLN
500.0000 mL | Freq: Once | INTRAVENOUS | Status: DC
Start: 2023-06-20 — End: 2024-06-10

## 2023-06-20 NOTE — Patient Instructions (Signed)
YOU HAD AN ENDOSCOPIC PROCEDURE TODAY AT THE [facility]:   Refer to the procedure report that was given to you for any specific questions about what was found during the examination.  If the procedure report does not answer your questions, please call your gastroenterologist to clarify.  If you requested that your care partner not be given the details of your procedure findings, then the procedure report has been included in a sealed envelope for you to review at your convenience later.  YOU SHOULD EXPECT: Some feelings of bloating in the abdomen. Passage of more gas than usual.  Walking can help get rid of the air that was put into your GI tract during the procedure and reduce the bloating. If you had a lower endoscopy (such as a colonoscopy or flexible sigmoidoscopy) you may notice spotting of blood in your stool or on the toilet paper. If you underwent a bowel prep for your procedure, you may not have a normal bowel movement for a few days.  Please Note:  You might notice some irritation and congestion in your nose or some drainage.  This is from the oxygen used during your procedure.  There is no need for concern and it should clear up in a day or so.  SYMPTOMS TO REPORT IMMEDIATELY:  Following lower endoscopy (colonoscopy or flexible sigmoidoscopy):  Excessive amounts of blood in the stool  Significant tenderness or worsening of abdominal pains  Swelling of the abdomen that is new, acute  Fever of 100F or higher   For urgent or emergent issues, a gastroenterologist can be reached at any hour by calling [phone]. Do not use MyChart messaging for urgent concerns.    DIET:  We do recommend a small meal at first, but then you may proceed to your regular diet.  Drink plenty of fluids but you should avoid alcoholic beverages for 24 hours.  MEDICATIONS: Continue present medications.  Please see handouts given to you by your recovery nurse: Polyps, Diverticulosis,  Hemorrhoids.  FOLLOW UP: Await pathology results. Repeat colonoscopy date will be determined after pending pathology results are reviewed for surveillance based on pathology results.  Thank you for allowing [place] to provide for your healthcare needs today.  ACTIVITY:  You should plan to take it easy for the rest of today and you should NOT DRIVE or use heavy machinery until tomorrow (because of the sedation medicines used during the test).    FOLLOW UP: Our staff will call the number listed on your records the next business day following your procedure.  We will call around 7:15- 8:00 am to check on you and address any questions or concerns that you may have regarding the information given to you following your procedure. If we do not reach you, we will leave a message.     If any biopsies were taken you will be contacted by phone or by letter within the next 1-3 weeks.  Please call us at [phone] if you have not heard about the biopsies in 3 weeks.    SIGNATURES/CONFIDENTIALITY: You and/or your care partner have signed paperwork which will be entered into your electronic medical record.  These signatures attest to the fact that that the information above on your After Visit Summary has been reviewed and is understood.  Full responsibility of the confidentiality of this discharge information lies with you and/or your care-partner.

## 2023-06-20 NOTE — Progress Notes (Signed)
[facility] History and Physical   Primary Care Physician:  Raliegh Ip, DO   Reason for Procedure:  Rectal bleeding, diarrhea, abnormal CT suggestive of colitis  Plan:    colonoscopy with possible interventions as needed     HPI: Holly Macdonald is a very pleasant 57 y.o. female here for colonoscopy for evaluation of rectal bleeding, diarrhea, abnormal CT suggestive of colitis.   The risks and benefits as well as alternatives of endoscopic procedure(s) have been discussed and reviewed. All questions answered. The patient agrees to proceed.    Past Medical History:  Diagnosis Date   Allergy    Anxiety    Arthritis    COPD (chronic obstructive pulmonary disease) (HCC)    Depression    Hyperlipidemia    Infectious colitis 2024   Sleep apnea     Past Surgical History:  Procedure Laterality Date   HYSTERECTOMY ABDOMINAL WITH SALPINGECTOMY     Tardy Ulna Palsy      Prior to Admission medications   Medication Sig Start Date End Date Taking? Authorizing Provider  Budeson-Glycopyrrol-Formoterol (BREZTRI AEROSPHERE) 160-9-4.8 MCG/ACT AERO Inhale 2 puffs into the lungs 2 (two) times daily. 03/13/23  Yes Delynn Flavin M, DO  famotidine (PEPCID) 20 MG tablet One after supper 02/22/23  Yes Nyoka Cowden, MD  pantoprazole (PROTONIX) 40 MG tablet TAKE 1 TABLET BY MOUTH ONCE DAILY 30 TO 60 MINUTES BEFORE FIRST MEAL OF THE DAY 05/24/23  Yes Nyoka Cowden, MD  sertraline (ZOLOFT) 50 MG tablet Take 1 tablet (50 mg total) by mouth daily. 02/13/23  Yes Delynn Flavin M, DO  varenicline (CHANTIX) 1 MG tablet Take 1 tablet (1 mg total) by mouth 2 (two) times daily. 02/13/23  Yes Gottschalk, Ashly M, DO  albuterol (VENTOLIN HFA) 108 (90 Base) MCG/ACT inhaler Inhale 1-2 puffs into the lungs every 6 (six) hours as needed. 02/13/23   Raliegh Ip, DO  ibuprofen (ADVIL) 200 MG tablet Take 400 mg by mouth every 6 (six) hours as needed. Patient not taking:  Reported on 06/20/2023    [provider]    Current Outpatient Medications  Medication Sig Dispense Refill   Budeson-Glycopyrrol-Formoterol (BREZTRI AEROSPHERE) 160-9-4.8 MCG/ACT AERO Inhale 2 puffs into the lungs 2 (two) times daily. 32.1 g 4   famotidine (PEPCID) 20 MG tablet One after supper 30 tablet 11   pantoprazole (PROTONIX) 40 MG tablet TAKE 1 TABLET BY MOUTH ONCE DAILY 30 TO 60 MINUTES BEFORE FIRST MEAL OF THE DAY 30 tablet 1   sertraline (ZOLOFT) 50 MG tablet Take 1 tablet (50 mg total) by mouth daily. 90 tablet 3   varenicline (CHANTIX) 1 MG tablet Take 1 tablet (1 mg total) by mouth 2 (two) times daily. 60 tablet 3   albuterol (VENTOLIN HFA) 108 (90 Base) MCG/ACT inhaler Inhale 1-2 puffs into the lungs every 6 (six) hours as needed. 18 g 2   ibuprofen (ADVIL) 200 MG tablet Take 400 mg by mouth every 6 (six) hours as needed. (Patient not taking: Reported on 06/20/2023)     Current Facility-Administered Medications  Medication Dose Route Frequency Provider Last Rate Last Admin   0.9 %  sodium chloride infusion  500 mL Intravenous Once Napoleon Form, MD        Allergies as of 06/20/2023 - Review Complete 06/20/2023  Allergen Reaction Noted   Morphine and codeine Hives, Shortness Of Breath, and Swelling 07/18/2012   Codeine Nausea And Vomiting 07/18/2012   Penicillins Hives 07/18/2012  Family History  Problem Relation Age of Onset   Cancer Mother    COPD Mother    Hearing loss Father    Cancer Maternal Aunt    Cancer Maternal Aunt    COPD Paternal Uncle    Cancer Maternal Grandmother    COPD Maternal Grandmother    Varicose Veins Maternal Grandfather    Arthritis Paternal Grandmother    COPD Paternal Grandfather    Hearing loss Paternal Grandfather    Early death Son    Mental illness Son    Depression Son    Colon cancer Neg Hx    Esophageal cancer Neg Hx    Rectal cancer Neg Hx    Stomach cancer Neg Hx    Breast cancer Neg Hx     Social  History   Socioeconomic History   Marital status: Married    Spouse name: Not on file   Number of children: Not on file   Years of education: Not on file   Highest education level: Not on file  Occupational History   Not on file  Tobacco Use   Smoking status: Every Day    Current packs/day: 1.50    Average packs/day: 1.5 packs/day for 51.0 years (76.6 ttl pk-yrs)    Types: Cigarettes    Start date: 06/02/1972   Smokeless tobacco: Never   Tobacco comments:    Pt currently smokes 7-13 cigarettes/day... Verified by [facility] 02/22/2023  Vaping Use   Vaping status: Never Used  Substance and Sexual Activity   Alcohol use: Yes    Alcohol/week: 1.0 standard drink of alcohol    Types: 1 Cans of beer per week    Comment: rarely   Drug use: No   Sexual activity: Never  Other Topics Concern   Not on file  Social History Narrative   Not on file   Social Determinants of Health   Financial Resource Strain: Not on file  Food Insecurity: Not on file  Transportation Needs: Not on file  Physical Activity: Not on file  Stress: Not on file  Social Connections: Not on file  Intimate Partner Violence: Not on file    Review of Systems:  All other review of systems negative except as mentioned in the HPI.  Physical Exam: Vital signs in last 24 hours: BP 113/78   Pulse 91   Temp (!) 96.8 F (36 C) (Skin)   Ht 5\' 2"  (1.575 m)   Wt 135 lb (61.2 kg)   SpO2 98%   BMI 24.69 kg/m  General:   Alert, NAD Lungs:  Clear .   Heart:  Regular rate and rhythm Abdomen:  Soft, nontender and nondistended. Neuro/Psych:  Alert and cooperative. Normal mood and affect. A and O x 3  Reviewed labs, radiology imaging, old records and pertinent past GI work up  Patient is appropriate for planned procedure(s) and anesthesia in an ambulatory setting   K. Scherry Ran , MD [phone]

## 2023-06-20 NOTE — Op Note (Signed)
[facility] Patient Name: Holly Macdonald Procedure Date: 06/20/2023 10:16 AM MRN: [id number] Endoscopist: Napoleon Form , MD, [id number] Age: 57 Referring MD:  Date of Birth: Jul 07, 1966 Gender: Female Account #: [id number] Procedure:                Colonoscopy Indications:              Evaluation of unexplained GI bleeding presenting                            with Hematochezia, Diarrhea (secondary to                            noninfectious colitis), Abnormal CT of the GI tract Medicines:                Monitored Anesthesia Care Procedure:                Pre-Anesthesia Assessment:                           - Prior to the procedure, a History and Physical                            was performed, and patient medications and                            allergies were reviewed. The patient's tolerance of                            previous anesthesia was also reviewed. The risks                            and benefits of the procedure and the sedation                            options and risks were discussed with the patient.                            All questions were answered, and informed consent                            was obtained. Prior Anticoagulants: The patient has                            taken no anticoagulant or antiplatelet agents. ASA                            Grade Assessment: II - A patient with mild systemic                            disease. After reviewing the risks and benefits,                            the patient was deemed in satisfactory condition to  undergo the procedure.                           After obtaining informed consent, the colonoscope                            was passed under direct vision. Throughout the                            procedure, the patient's blood pressure, pulse, and                            oxygen saturations were monitored continuously. The                            Olympus  Scope [id number] was introduced through the                            anus and advanced to the the cecum, identified by                            the ileocecal valve. The colonoscopy was performed                            without difficulty. The patient tolerated the                            procedure well. The quality of the bowel                            preparation was good. The ileocecal valve,                            appendiceal orifice, and rectum were photographed. Scope In: 10:24:24 AM Scope Out: 10:37:00 AM Scope Withdrawal Time: 0 hours 10 minutes 24 seconds  Total Procedure Duration: 0 hours 12 minutes 36 seconds  Findings:                 The perianal and digital rectal examinations were                            normal.                           Two sessile polyps were found in the ascending                            colon and cecum. The polyps were 3 to 7 mm in size.                            These polyps were removed with a cold snare.                            Resection and retrieval were complete.  Scattered large-mouthed, medium-mouthed and                            small-mouthed diverticula were found in the sigmoid                            colon, descending colon, transverse colon and                            ascending colon.                           Normal mucosa was found in the colon otherwise with                            no features of colitis.                           A localized area of granular mucosa was found at                            the anus and in the distal rectum. Biopsies were                            taken with a cold forceps for histology.                           Non-bleeding external and internal hemorrhoids were                            found during retroflexion. The hemorrhoids were                            medium-sized. Complications:            No immediate complications. Estimated  Blood Loss:     Estimated blood loss was minimal. Impression:               - Two 3 to 7 mm polyps in the ascending colon and                            in the cecum, removed with a cold snare. Resected                            and retrieved.                           - Diverticulosis in the sigmoid colon, in the                            descending colon, in the transverse colon and in                            the ascending colon.                           -  Normal mucosa in the entire examined colon. CT                            findings suggestive of colitis is likely secondary                            to ischemic colitis                           - Granularity at the anus and in the distal rectum.                            Biopsied.                           - Non-bleeding external and internal hemorrhoids. Recommendation:           - Patient has a contact number available for                            emergencies. The signs and symptoms of potential                            delayed complications were discussed with the                            patient. Return to normal activities tomorrow.                            Written discharge instructions were provided to the                            patient.                           - Resume previous diet.                           - Continue present medications.                           - Await pathology results.                           - Repeat colonoscopy date to be determined after                            pending pathology results are reviewed for                            surveillance based on pathology results. Napoleon Form, MD 06/20/2023 10:45:36 AM This report has been signed electronically.

## 2023-06-20 NOTE — Progress Notes (Signed)
Pt's states no medical or surgical changes since previsit or office visit. VS assessed by this Nurse.

## 2023-06-20 NOTE — Progress Notes (Signed)
Called to room to assist during endoscopic procedure.  Patient ID and intended procedure confirmed with present staff. Received instructions for my participation in the procedure from the performing physician.  

## 2023-06-20 NOTE — Progress Notes (Signed)
Sedate, gd SR, tolerated procedure well, VSS, report to RN 

## 2023-06-21 ENCOUNTER — Telehealth: Payer: Self-pay | Admitting: *Deleted

## 2023-06-21 NOTE — Telephone Encounter (Signed)
  Follow up Call-     06/20/2023    9:03 AM  Call back number  Post procedure Call Back phone  # [phone]  Permission to leave phone message Yes     Patient questions:  Do you have a fever, pain , or abdominal swelling? No. Pain Score  0 *  Have you tolerated food without any problems? Yes.    Have you been able to return to your normal activities? Yes.    Do you have any questions about your discharge instructions: Diet   No. Medications  No. Follow up visit  No.  Do you have questions or concerns about your Care? No.  Actions: * If pain score is 4 or above: No action needed, pain <4.

## 2023-06-24 LAB — SURGICAL PATHOLOGY

## 2023-07-04 ENCOUNTER — Encounter: Payer: Self-pay | Admitting: Gastroenterology

## 2023-11-02 ENCOUNTER — Other Ambulatory Visit: Payer: Self-pay | Admitting: Family Medicine

## 2023-11-02 DIAGNOSIS — Z720 Tobacco use: Secondary | ICD-10-CM

## 2023-12-08 ENCOUNTER — Telehealth: Admitting: Family

## 2023-12-08 DIAGNOSIS — J069 Acute upper respiratory infection, unspecified: Secondary | ICD-10-CM

## 2023-12-08 DIAGNOSIS — H6991 Unspecified Eustachian tube disorder, right ear: Secondary | ICD-10-CM | POA: Diagnosis not present

## 2023-12-08 MED ORDER — CETIRIZINE HCL 10 MG PO TABS
10.0000 mg | ORAL_TABLET | Freq: Every day | ORAL | 1 refills | Status: DC
Start: 2023-12-08 — End: 2024-05-28

## 2023-12-08 MED ORDER — FLUTICASONE PROPIONATE 50 MCG/ACT NA SUSP
2.0000 | Freq: Every day | NASAL | 6 refills | Status: DC
Start: 2023-12-08 — End: 2024-06-10

## 2023-12-08 NOTE — Progress Notes (Signed)

## 2024-02-05 ENCOUNTER — Ambulatory Visit: Payer: Self-pay

## 2024-02-05 ENCOUNTER — Encounter: Payer: Self-pay | Admitting: Family Medicine

## 2024-02-05 ENCOUNTER — Ambulatory Visit: Admitting: Family Medicine

## 2024-02-05 VITALS — BP 130/85 | HR 60 | Temp 98.0°F | Ht 62.0 in | Wt 142.0 lb

## 2024-02-05 DIAGNOSIS — K625 Hemorrhage of anus and rectum: Secondary | ICD-10-CM | POA: Diagnosis not present

## 2024-02-05 DIAGNOSIS — F419 Anxiety disorder, unspecified: Secondary | ICD-10-CM

## 2024-02-05 DIAGNOSIS — Z1331 Encounter for screening for depression: Secondary | ICD-10-CM | POA: Diagnosis not present

## 2024-02-05 MED ORDER — CIPROFLOXACIN HCL 500 MG PO TABS
500.0000 mg | ORAL_TABLET | Freq: Two times a day (BID) | ORAL | 0 refills | Status: DC
Start: 2024-02-05 — End: 2024-06-10

## 2024-02-05 NOTE — Progress Notes (Signed)
 Subjective:  Patient ID: Holly Macdonald, female    DOB: Jun 06, 1966, 58 y.o.   MRN: [id number]  Patient Care Team: Eliodoro Guerin, DO as PCP - General (Family Medicine) Sheryle Donning, MD as PCP - Cardiology (Cardiology) Sergio Dandy, MD as Consulting Physician (Gastroenterology) Diamond Formica, MD as Consulting Physician (Pulmonary Disease)   Chief Complaint:  Rectal Bleeding  HPI: Holly Macdonald is a 58 y.o. female presenting on 02/05/2024 for Rectal Bleeding  Rectal Bleeding    States that she had colitis in the past and that this feels the same. Symptoms started last night with pain and sweating. States that she had BM and then felt better. Today woke up and had another BM and noticed bright red blood. Reports that she is having nausea. Has not had vomiting. Reports pain on her left side that causes her to double over at times. Felt that she was "on fire" last night and had cold sweat last night. Denies any triggers. Last year when treated for colitis she was treated with cipro  and flagyl  which she tolerated well. She has started probiotics. Endorses slight constipation prior to this which may have triggered event.   Relevant past medical, surgical, family, and social history reviewed and updated as indicated.  Allergies and medications reviewed and updated. Data reviewed: Chart in Epic.   Past Medical History:  Diagnosis Date   Allergy    Anxiety    Arthritis    COPD (chronic obstructive pulmonary disease) (HCC)    Depression    Hyperlipidemia    Infectious colitis 2024   Sleep apnea     Past Surgical History:  Procedure Laterality Date   HYSTERECTOMY ABDOMINAL WITH SALPINGECTOMY     Tardy Ulna Palsy      Social History   Socioeconomic History   Marital status: Married    Spouse name: Not on file   Number of children: Not on file   Years of education: Not on file   Highest education level: Not on file  Occupational History   Not  on file  Tobacco Use   Smoking status: Every Day    Current packs/day: 1.50    Average packs/day: 1.5 packs/day for 51.7 years (77.5 ttl pk-yrs)    Types: Cigarettes    Start date: 06/02/1972   Smokeless tobacco: Never   Tobacco comments:    Pt currently smokes 7-13 cigarettes/day... Verified by [facility] 02/22/2023  Vaping Use   Vaping status: Never Used  Substance and Sexual Activity   Alcohol use: Yes    Alcohol/week: 1.0 standard drink of alcohol    Types: 1 Cans of beer per week    Comment: rarely   Drug use: No   Sexual activity: Never  Other Topics Concern   Not on file  Social History Narrative   Not on file   Social Drivers of Health   Financial Resource Strain: Not on file  Food Insecurity: Not on file  Transportation Needs: Not on file  Physical Activity: Not on file  Stress: Not on file  Social Connections: Not on file  Intimate Partner Violence: Not on file    Outpatient Encounter Medications as of 02/05/2024  Medication Sig   albuterol  (VENTOLIN  HFA) 108 (90 Base) MCG/ACT inhaler Inhale 1-2 puffs into the lungs every 6 (six) hours as needed.   Budeson-Glycopyrrol-Formoterol  (BREZTRI  AEROSPHERE) 160-9-4.8 MCG/ACT AERO Inhale 2 puffs into the lungs 2 (two) times daily.   cetirizine  (ZYRTEC  ALLERGY) 10 MG  tablet Take 1 tablet (10 mg total) by mouth daily.   famotidine  (PEPCID ) 20 MG tablet One after supper   fluticasone  (FLONASE ) 50 MCG/ACT nasal spray Place 2 sprays into both nostrils daily.   ibuprofen (ADVIL) 200 MG tablet Take 400 mg by mouth every 6 (six) hours as needed.   pantoprazole  (PROTONIX ) 40 MG tablet TAKE 1 TABLET BY MOUTH ONCE DAILY 30 TO 60 MINUTES BEFORE FIRST MEAL OF THE DAY   sertraline  (ZOLOFT ) 50 MG tablet Take 1 tablet (50 mg total) by mouth daily.   varenicline  (CHANTIX ) 1 MG tablet Take 1 tablet by mouth twice daily   Facility-Administered Encounter Medications as of 02/05/2024  Medication   0.9 %  sodium chloride  infusion    Allergies   Allergen Reactions   Morphine And Codeine Hives, Shortness Of Breath and Swelling   Codeine Nausea And Vomiting   Penicillins Hives    Review of Systems  Gastrointestinal:  Positive for hematochezia.   Objective:  BP 130/85   Pulse 60   Temp 98 F (36.7 C)   Ht 5\' 2"  (1.575 m)   Wt 142 lb (64.4 kg)   SpO2 98%   BMI 25.97 kg/m    Wt Readings from Last 3 Encounters:  02/05/24 142 lb (64.4 kg)  06/20/23 135 lb (61.2 kg)  05/28/23 135 lb (61.2 kg)   Physical Exam Chaperone present: declined chaperone.  Constitutional:      General: She is awake. She is not in acute distress.    Appearance: Normal appearance. She is well-developed and well-groomed. She is not ill-appearing, toxic-appearing or diaphoretic.  Cardiovascular:     Rate and Rhythm: Normal rate and regular rhythm.     Pulses: Normal pulses.     Heart sounds: Normal heart sounds. No murmur heard.    No gallop.  Pulmonary:     Effort: Pulmonary effort is normal. No respiratory distress.     Breath sounds: Normal breath sounds. No stridor. No wheezing, rhonchi or rales.  Genitourinary:    Rectum: External hemorrhoid present.       Comments: External hemorrhoids present, not thrombosed, nontender  Musculoskeletal:     Cervical back: Full passive range of motion without pain and neck supple.  Skin:    General: Skin is warm.     Capillary Refill: Capillary refill takes less than 2 seconds.  Neurological:     General: No focal deficit present.     Mental Status: She is alert, oriented to person, place, and time and easily aroused. Mental status is at baseline.     GCS: GCS eye subscore is 4. GCS verbal subscore is 5. GCS motor subscore is 6.     Motor: No weakness.  Psychiatric:        Attention and Perception: Attention and perception normal.        Mood and Affect: Mood and affect normal.        Speech: Speech normal.        Behavior: Behavior normal. Behavior is cooperative.        Thought Content: Thought  content normal. Thought content does not include homicidal or suicidal ideation. Thought content does not include homicidal or suicidal plan.        Cognition and Memory: Cognition and memory normal.        Judgment: Judgment normal.     Results for orders placed or performed in visit on 06/20/23  Surgical pathology ([facility])   Collection Time: 06/20/23 12:00  AM  Result Value Ref Range   SURGICAL PATHOLOGY      SURGICAL PATHOLOGY [facility] [address], [address] [place], [place] [address] Telephone [phone] or [phone] Fax [phone]  REPORT OF SURGICAL PATHOLOGY   Accession #: [id number] Patient Name: LEANNE, SISLER Visit # : [id number]  MRN: [id number] Physician: Sergio Dandy DOB/Age [birth year] (Age: 33) Gender: F Collected Date: 06/20/2023 Received Date: 06/21/2023  FINAL DIAGNOSIS       1. Surgical [P], colon ascending and cecum, polyp (2) :       TUBULAR ADENOMA, 4 FRAGMENTS      NEGATIVE FOR HIGH-GRADE DYSPLASIA AND CARCINOMA       2. Surgical [P], colon, anorectal bxs :       HYPERPLASTIC POLYP      NEGATIVE FOR DYSPLASIA AND CARCINOMA       ELECTRONIC SIGNATURE : Picklesimer Md, Fred , Sports administrator, International aid/development worker  MICROSCOPIC DESCRIPTION  CASE COMMENTS STAINS USED IN DIAGNOSIS: H&E H&E    CLINICAL HISTORY  SPECIMEN(S) OBTAINED 1. Surgical [P], Colon Ascending And Cecum, Polyp (2) 2. Surgic al [P], Colon, Anorectal Bxs  SPECIMEN COMMENTS: 1. History of colitis; benign neopalsm of cecum; benign neoplasm of ascending colon SPECIMEN CLINICAL INFORMATION: 1. R/O adenoma 2. R/O other AIN    Gross Description 1. Received in formalin are tan, soft tissue fragments that are submitted in toto.Number: 3, Size: 0.2 cm smallest to 0.3 cm largest, (1B) ( TA ) 2. Received in formalin are tan, soft tissue fragments that are submitted in toto.Number: 3, Size: 0.2 cm smallest to 0.3 cm largest, (1B)  ( TA )        Report signed out from the following location(s) [place] [address] [place], [place] [address] CLIA #: [id number]  [facility] [address] [place], [place] [address] CLIA #: [id number]        02/05/2024   11:57 AM 02/13/2023    3:57 PM 12/05/2022    3:22 PM 05/15/2022    3:51 PM 04/11/2022    1:49 PM  Depression screen PHQ 2/9  Decreased Interest 0 1 1 1 3   Down, Depressed, Hopeless 0 1 0 1 1  PHQ - 2 Score 0 2 1 2 4   Altered sleeping 3 1 2 3 3   Tired, decreased energy 3 1 2 3 3   Change in appetite 3 1 2 2 3   Feeling bad or failure about yourself  0 0 0 1 2  Trouble concentrating 1 0 2 3 3   Moving slowly or fidgety/restless 0 0 0 0 2  Suicidal thoughts 0 0 0 0 0  PHQ-9 Score 10 5 9 14 20   Difficult doing work/chores Somewhat difficult Somewhat difficult Not difficult at all Somewhat difficult Somewhat difficult       02/05/2024   11:58 AM 02/13/2023    3:57 PM 12/05/2022    3:22 PM 05/15/2022    3:51 PM  GAD 7 : Generalized Anxiety Score  Nervous, Anxious, on Edge 0 2 3 3   Control/stop worrying 0 2 2 3   Worry too much - different things 0 1 2 3   Trouble relaxing 1 2 3 3   Restless 1 0 1 3  Easily annoyed or irritable 0 1 1 3   Afraid - awful might happen 0 0 2 3  Total GAD 7 Score 2 8 14 21   Anxiety Difficulty Not difficult at all  Not difficult at  all Somewhat difficult    Pertinent labs & imaging results that were available during my care of the patient were reviewed by me and considered in my medical decision making.  Assessment & Plan:  Shlonda was seen today for rectal bleeding.  Diagnoses and all orders for this visit:  Bright red rectal bleeding Labs as below. Will communicate results to patient once available. Will await results to determine next steps.  Will start empiric treatment as below given history of colitis. Patient to follow up with GI. Strict return precautions given.  -     CBC with  Differential/Platelet -     CMP14+EGFR -     ciprofloxacin  (CIPRO ) 500 MG tablet; Take 1 tablet (500 mg total) by mouth 2 (two) times daily. -     Cdiff NAA+O+P+Stool Culture -     Fecal occult blood, imunochemical; Future  Anxiety Stable. Denies SI. Patient to follow up with PCP.   Encounter for screening for depression Patient screening elevated. Denies SI. Patient to follow up with PCP.    Continue all other maintenance medications.  Follow up plan: Return if symptoms worsen or fail to improve.   Continue healthy lifestyle choices, including diet (rich in fruits, vegetables, and lean proteins, and low in salt and simple carbohydrates) and exercise (at least 30 minutes of moderate physical activity daily).  Written and verbal instructions provided   The above assessment and management plan was discussed with the patient. The patient verbalized understanding of and has agreed to the management plan. Patient is aware to call the clinic if they develop any new symptoms or if symptoms persist or worsen. Patient is aware when to return to the clinic for a follow-up visit. Patient educated on when it is appropriate to go to the emergency department.   Jacqualyn Mates, DNP-FNP [facility] [address] [place], [place] [address] [phone]

## 2024-02-05 NOTE — Telephone Encounter (Signed)
 FYI Only or Action Required?: FYI only for provider  Patient was last seen in primary care on 03/13/23. Called Nurse Triage reporting Rectal Bleeding. Symptoms began today. Interventions attempted: Nothing. Symptoms are: stable.  Triage Disposition: See PCP When Office is Open (Within 3 Days)  Patient/caregiver understands and will follow disposition?: Yes, will follow disposition Copied from CRM [id number]. Topic: Clinical - Red Word Triage >> Feb 05, 2024 10:03 AM Baldomero Bone wrote: Red Word that prompted transfer to Nurse Triage: Patient is bleeding blood from bottom that started last night after an episode of constipation. Diagnosed with colitis last year in July. Callback number is [phone] Reason for Disposition  MILD rectal bleeding (more than just a few drops or streaks)  Answer Assessment - Initial Assessment Questions 1. APPEARANCE of BLOOD: "What color is it?" "Is it passed separately, on the surface of the stool, or mixed in with the stool?"      Light red 2. AMOUNT: "How much blood was passed?"      Mild amount 3. FREQUENCY: "How many times has blood been passed with the stools?"      Only one BM, states that the blood was in that BM 4. ONSET: "When was the blood first seen in the stools?" (Days or weeks)      today 5. DIARRHEA: "Is there also some diarrhea?" If Yes, ask: "How many diarrhea stools in the past 24 hours?"      formed 6. CONSTIPATION: "Do you have constipation?" If Yes, ask: "How bad is it?"     Felt like I was until yesterday 7. RECURRENT SYMPTOMS: "Have you had blood in your stools before?" If Yes, ask: "When was the last time?" and "What happened that time?"      2024 July, was dx with colitis and had the same symptoms 8. BLOOD THINNERS: "Do you take any blood thinners?" (e.g., Coumadin/warfarin, Pradaxa/dabigatran, aspirin)     denies 9. OTHER SYMPTOMS: "Do you have any other symptoms?"  (e.g., abdomen pain, vomiting, dizziness, fever)     Denies, states that  she had abd pain and cold sweats yesterday before her Large BM  Protocols used: Rectal Bleeding-A-AH

## 2024-02-06 LAB — CBC WITH DIFFERENTIAL/PLATELET
Basophils Absolute: 0.1 10*3/uL (ref 0.0–0.2)
Basos: 1 %
EOS (ABSOLUTE): 0.1 10*3/uL (ref 0.0–0.4)
Eos: 1 %
Hematocrit: 41.3 % (ref 34.0–46.6)
Hemoglobin: 13.6 g/dL (ref 11.1–15.9)
Immature Grans (Abs): 0 10*3/uL (ref 0.0–0.1)
Immature Granulocytes: 0 %
Lymphocytes Absolute: 2.7 10*3/uL (ref 0.7–3.1)
Lymphs: 28 %
MCH: 31 pg (ref 26.6–33.0)
MCHC: 32.9 g/dL (ref 31.5–35.7)
MCV: 94 fL (ref 79–97)
Monocytes Absolute: 0.5 10*3/uL (ref 0.1–0.9)
Monocytes: 6 %
Neutrophils Absolute: 6.3 10*3/uL (ref 1.4–7.0)
Neutrophils: 64 %
Platelets: 289 10*3/uL (ref 150–450)
RBC: 4.39 x10E6/uL (ref 3.77–5.28)
RDW: 13.6 % (ref 11.7–15.4)
WBC: 9.7 10*3/uL (ref 3.4–10.8)

## 2024-02-06 LAB — CMP14+EGFR
ALT: 16 IU/L (ref 0–32)
AST: 15 IU/L (ref 0–40)
Albumin: 4.2 g/dL (ref 3.8–4.9)
Alkaline Phosphatase: 112 IU/L (ref 44–121)
BUN/Creatinine Ratio: 9 (ref 9–23)
BUN: 8 mg/dL (ref 6–24)
Bilirubin Total: <0.2 mg/dL (ref 0.0–1.2)
CO2: 21 mmol/L (ref 20–29)
Calcium: 9.3 mg/dL (ref 8.7–10.2)
Chloride: 103 mmol/L (ref 96–106)
Creatinine, Ser: 0.94 mg/dL (ref 0.57–1.00)
Globulin, Total: 2.6 g/dL (ref 1.5–4.5)
Glucose: 86 mg/dL (ref 70–99)
Potassium: 4.3 mmol/L (ref 3.5–5.2)
Sodium: 139 mmol/L (ref 134–144)
Total Protein: 6.8 g/dL (ref 6.0–8.5)
eGFR: 70 mL/min/{1.73_m2} (ref >59)

## 2024-02-12 ENCOUNTER — Other Ambulatory Visit

## 2024-02-12 DIAGNOSIS — K625 Hemorrhage of anus and rectum: Secondary | ICD-10-CM

## 2024-02-18 ENCOUNTER — Other Ambulatory Visit: Payer: Self-pay | Admitting: Family Medicine

## 2024-02-18 DIAGNOSIS — F4320 Adjustment disorder, unspecified: Secondary | ICD-10-CM

## 2024-02-21 LAB — CDIFF NAA+O+P+STOOL CULTURE
E coli, Shiga toxin Assay: NEGATIVE
Toxigenic C. Difficile by PCR: NEGATIVE

## 2024-02-21 IMAGING — US US SOFT TISSUE HEAD/NECK
1 series · 14 of 25 positions shown · non-contrast
Comparison: None.

CLINICAL DATA: Chronic right neck pain

EXAM:
ULTRASOUND OF HEAD/NECK SOFT TISSUES
TECHNIQUE: Ultrasound examination of the head and neck soft tissues was
performed in the area of clinical concern.

[Series 1: us soft tissue head & neck (non-thyroid) · 14 of 33 slices shown]
[im 1/33]
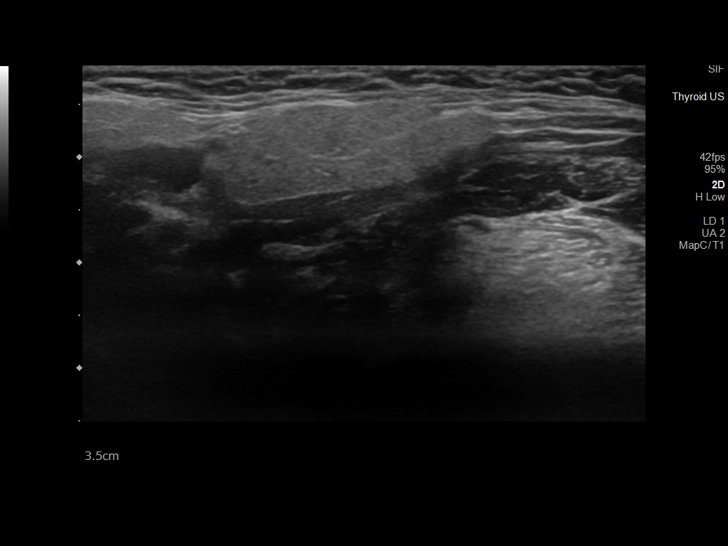
[im 3/33]
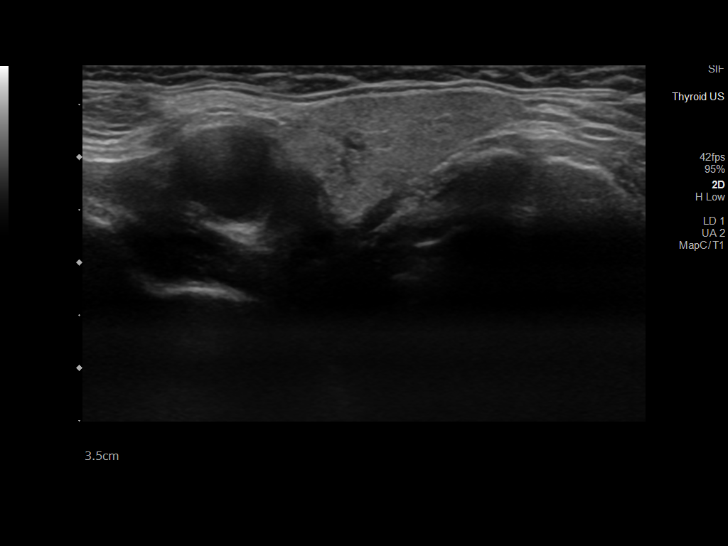
[im 6/33]
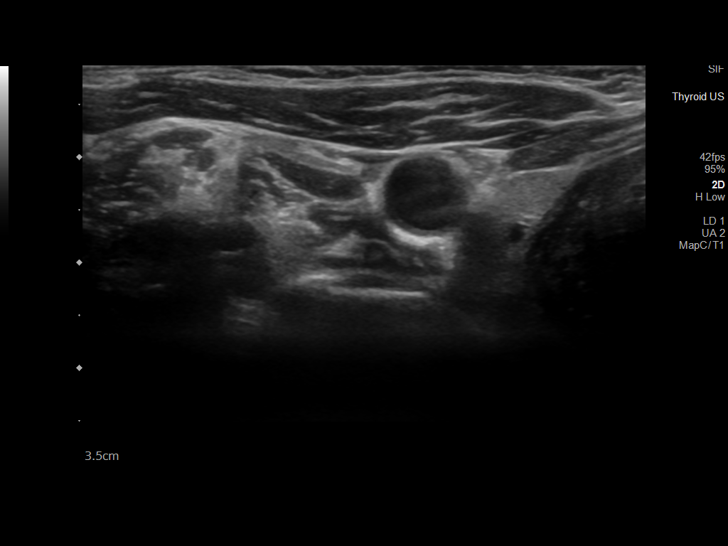
[im 9/33]
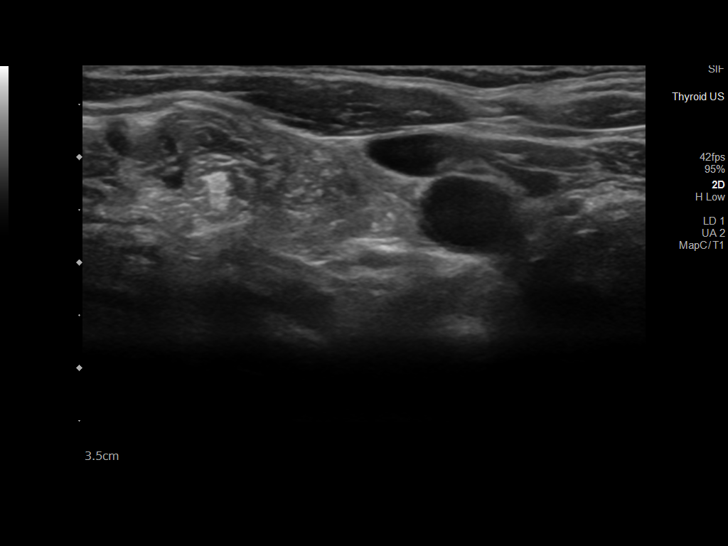
[im 11/33]
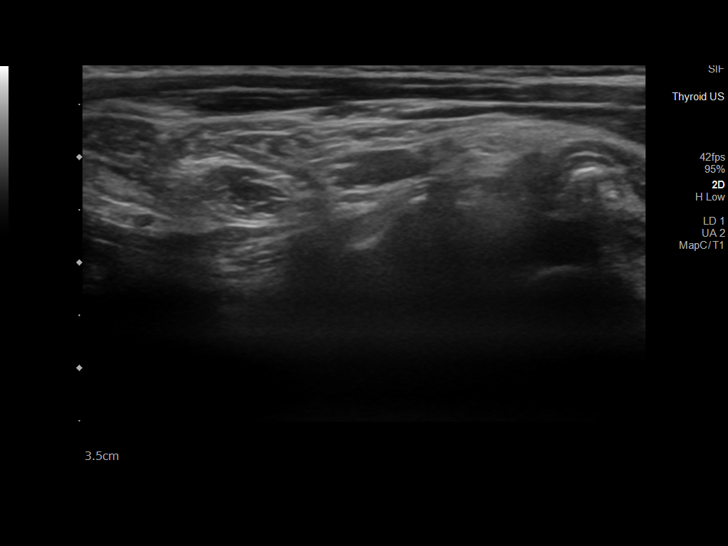
[im 13/33]
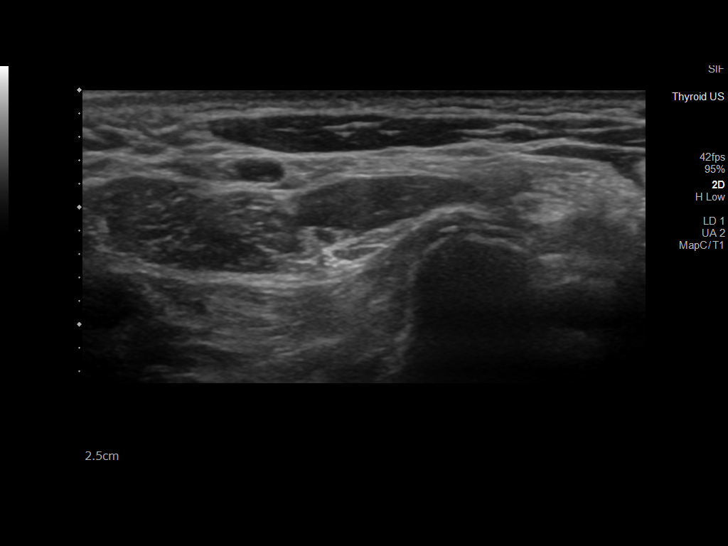
[im 15/33]
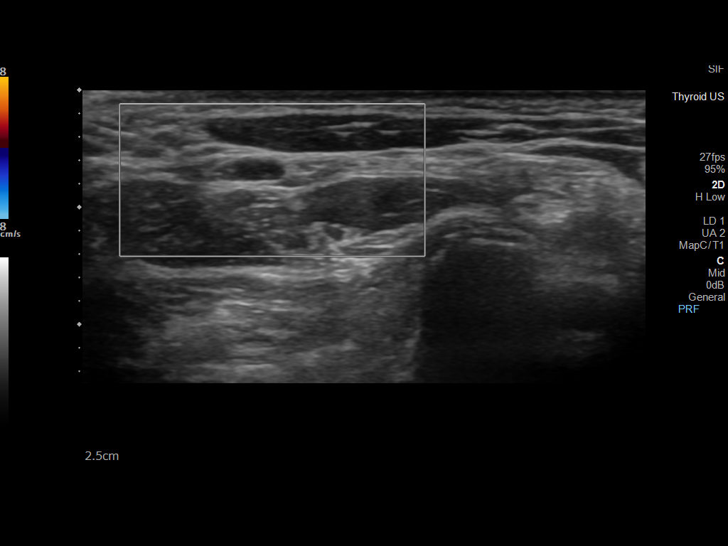
[im 18/33]
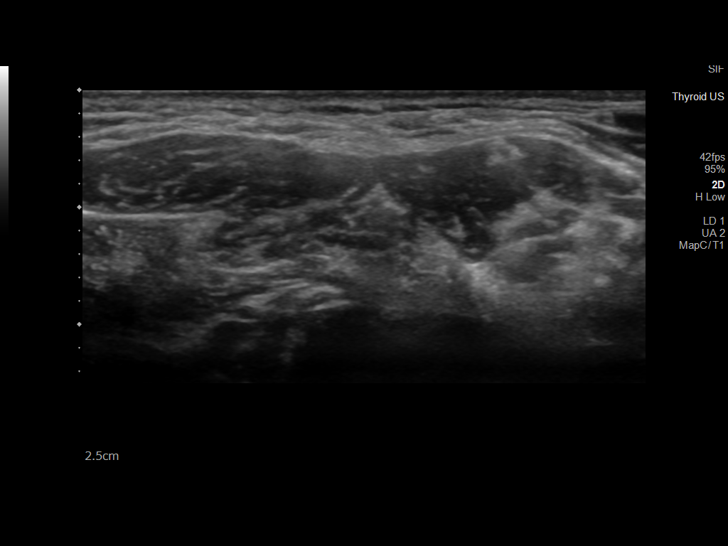
[im 21/33]
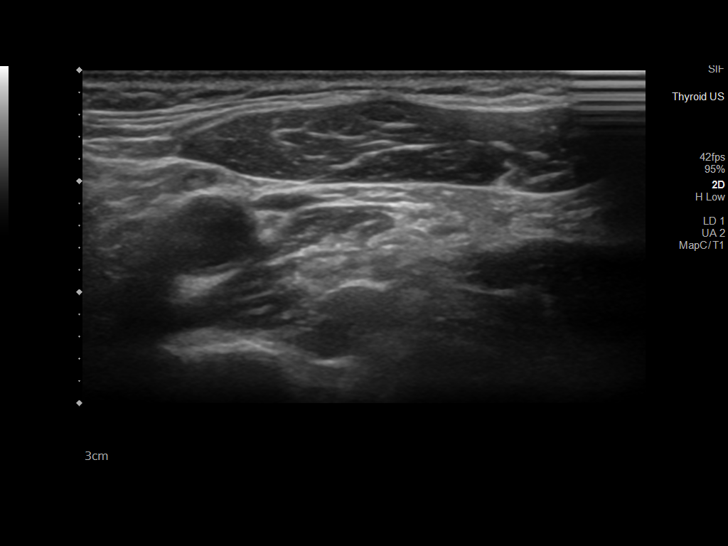
[im 22/33]
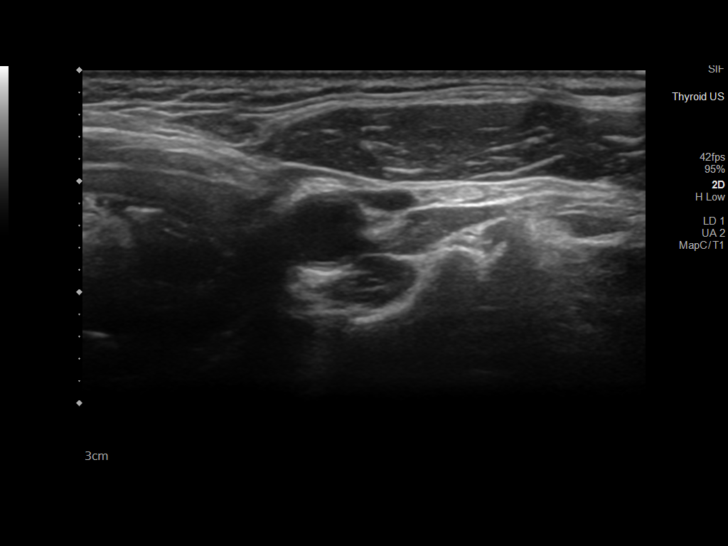
[im 25/33]
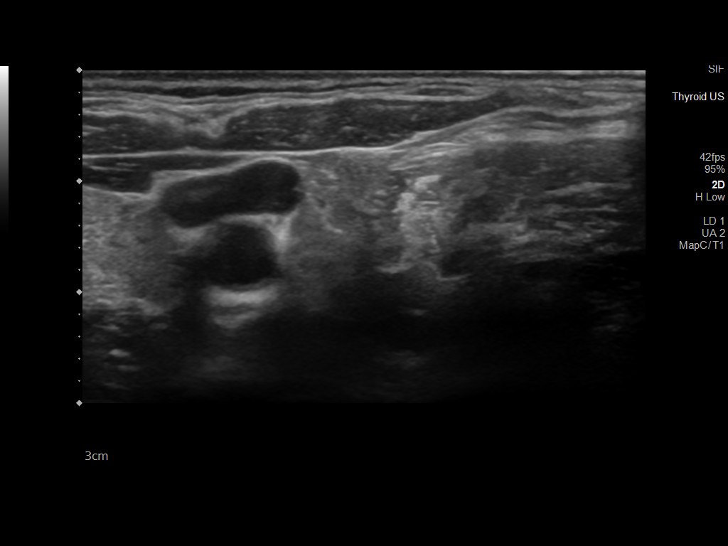
[im 27/33]
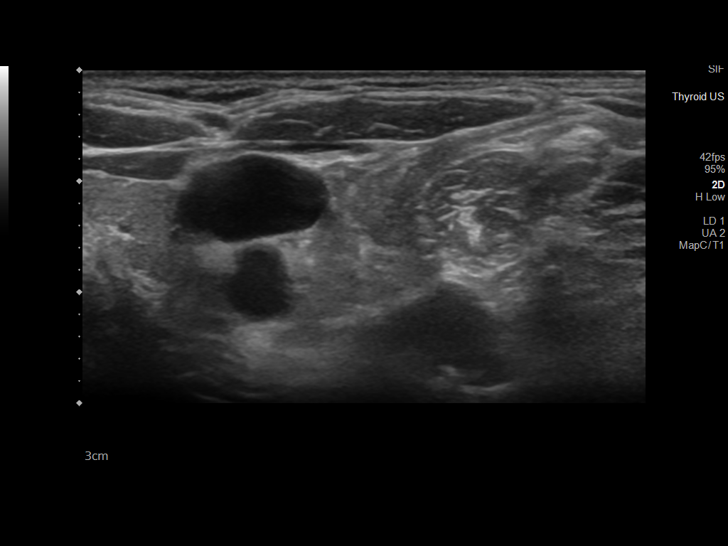
[im 30/33]
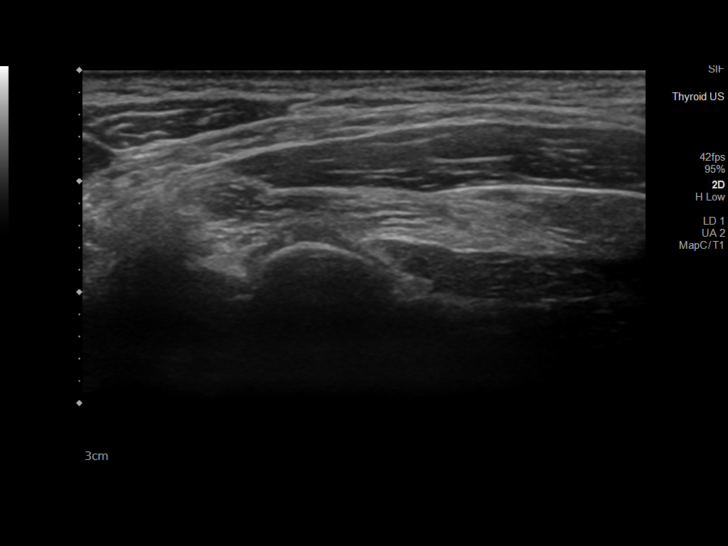
[im 33/33]
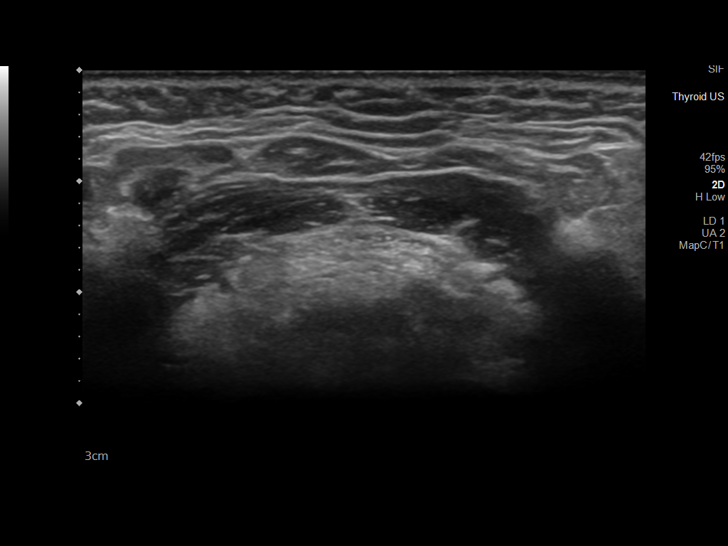

[14 of 25 positions shown; findings below may reference images not displayed]

FINDINGS: Targeted sonographic evaluation of the area of concern in the right
neck demonstrates no significant sonographic abnormality.
Nonenlarged lymph node is seen in the region.
IMPRESSION: No significant sonographic abnormality identified in the region of
concern in the right neck.

## 2024-02-25 ENCOUNTER — Ambulatory Visit: Payer: Self-pay | Admitting: Family Medicine

## 2024-02-25 NOTE — Progress Notes (Signed)
 All labs normal. Recommend patient follow up if symptoms continue.

## 2024-03-02 ENCOUNTER — Other Ambulatory Visit: Payer: Self-pay | Admitting: Internal Medicine

## 2024-03-30 ENCOUNTER — Encounter: Payer: Self-pay | Admitting: Family Medicine

## 2024-03-30 ENCOUNTER — Other Ambulatory Visit: Payer: Self-pay | Admitting: Family Medicine

## 2024-03-30 DIAGNOSIS — J449 Chronic obstructive pulmonary disease, unspecified: Secondary | ICD-10-CM

## 2024-03-30 NOTE — Telephone Encounter (Signed)
 Letter sent.

## 2024-03-30 NOTE — Telephone Encounter (Signed)
 [facility] NTBS lst OV 03/13/23 for chronic FU No RF sent to pharmacy last OV greater than a year

## 2024-05-15 ENCOUNTER — Other Ambulatory Visit: Payer: Self-pay | Admitting: Family Medicine

## 2024-05-15 DIAGNOSIS — F4320 Adjustment disorder, unspecified: Secondary | ICD-10-CM

## 2024-05-28 ENCOUNTER — Other Ambulatory Visit: Payer: Self-pay | Admitting: Family

## 2024-05-28 DIAGNOSIS — H6991 Unspecified Eustachian tube disorder, right ear: Secondary | ICD-10-CM

## 2024-05-28 DIAGNOSIS — J069 Acute upper respiratory infection, unspecified: Secondary | ICD-10-CM

## 2024-06-03 ENCOUNTER — Other Ambulatory Visit: Payer: Self-pay | Admitting: Medical Genetics

## 2024-06-10 ENCOUNTER — Ambulatory Visit: Admitting: Family Medicine

## 2024-06-10 ENCOUNTER — Other Ambulatory Visit (HOSPITAL_COMMUNITY)
Admission: RE | Admit: 2024-06-10 | Discharge: 2024-06-10 | Disposition: A | Discharge status: Home / Self Care | Payer: Self-pay | Source: Ambulatory Visit | Attending: Oncology | Admitting: Oncology

## 2024-06-10 VITALS — BP 138/72 | HR 62 | Temp 97.4°F | Ht 62.0 in | Wt 144.5 lb

## 2024-06-10 DIAGNOSIS — L820 Inflamed seborrheic keratosis: Secondary | ICD-10-CM

## 2024-06-10 DIAGNOSIS — E7800 Pure hypercholesterolemia, unspecified: Secondary | ICD-10-CM | POA: Diagnosis not present

## 2024-06-10 DIAGNOSIS — H6991 Unspecified Eustachian tube disorder, right ear: Secondary | ICD-10-CM | POA: Diagnosis not present

## 2024-06-10 DIAGNOSIS — F4320 Adjustment disorder, unspecified: Secondary | ICD-10-CM | POA: Diagnosis not present

## 2024-06-10 DIAGNOSIS — J449 Chronic obstructive pulmonary disease, unspecified: Secondary | ICD-10-CM | POA: Diagnosis not present

## 2024-06-10 MED ORDER — ALBUTEROL SULFATE HFA 108 (90 BASE) MCG/ACT IN AERS
1.0000 | INHALATION_SPRAY | Freq: Four times a day (QID) | RESPIRATORY_TRACT | 2 refills | Status: AC | PRN
Start: 2024-06-10 — End: ?

## 2024-06-10 MED ORDER — CETIRIZINE HCL 10 MG PO TABS: 10.0000 mg | ORAL_TABLET | Freq: Every day | ORAL | 3 refills | Status: AC

## 2024-06-10 MED ORDER — FLUTICASONE PROPIONATE 50 MCG/ACT NA SUSP: 2.0000 | Freq: Every day | NASAL | 6 refills | Status: AC

## 2024-06-10 MED ORDER — BREZTRI AEROSPHERE 160-9-4.8 MCG/ACT IN AERO: 2.0000 | INHALATION_SPRAY | Freq: Two times a day (BID) | RESPIRATORY_TRACT | 4 refills | Status: AC

## 2024-06-10 MED ORDER — SERTRALINE HCL 50 MG PO TABS: 50.0000 mg | ORAL_TABLET | Freq: Every day | ORAL | 4 refills | Status: AC

## 2024-06-10 NOTE — Progress Notes (Signed)
 Subjective: CC: medication refills PCP: Jolinda Norene HERO, DO Holly Macdonald is a 58 y.o. female presenting to clinic today for:  Needs all refilled.  Reports some right sided ear squishing, where she feels like she has water in her ear.  She does not report any drainage, fevers.  She is compliant with oral antihistamine but has not been utilizing her nasal spray.  Would like a refill on that  Needs refills on her inhalers.  Reports breathing is stable.  Previously seen by Dr. Darlean.  Continues to smoke  Biggest concern today is that she has a spot on the right posterior thigh she thinks maybe she needs to see a dermatologist about it.  She notes that it is easily peeled off and frequently gets irritated and caught on clothing.  She has a few other lesions similar on her bra line but they are much smaller.  Has been on her scalp as well   ROS: Per HPI  Allergies  Allergen Reactions   Morphine And Codeine Hives, Shortness Of Breath and Swelling   Codeine Nausea And Vomiting   Penicillins Hives   Past Medical History:  Diagnosis Date   Allergy    Anxiety    Arthritis    COPD (chronic obstructive pulmonary disease) (HCC)    Depression    Hyperlipidemia    Infectious colitis 2024   Sleep apnea     Current Outpatient Medications:    albuterol  (VENTOLIN  HFA) 108 (90 Base) MCG/ACT inhaler, Inhale 1-2 puffs into the lungs every 6 (six) hours as needed., Disp: 18 g, Rfl: 2   Budeson-Glycopyrrol-Formoterol  (BREZTRI  AEROSPHERE) 160-9-4.8 MCG/ACT AERO, Inhale 2 puffs into the lungs 2 (two) times daily., Disp: 32.1 g, Rfl: 4   cetirizine  (ZYRTEC ) 10 MG tablet, Take 1 tablet by mouth once daily, Disp: 90 tablet, Rfl: 1   famotidine  (PEPCID ) 20 MG tablet, TAKE 1 TABLET BY MOUTH ONCE DAILY AFTER SUPPER, Disp: 30 tablet, Rfl: 0   fluticasone  (FLONASE ) 50 MCG/ACT nasal spray, Place 2 sprays into both nostrils daily., Disp: 16 g, Rfl: 6   ibuprofen (ADVIL) 200 MG tablet, Take 400 mg  by mouth every 6 (six) hours as needed., Disp: , Rfl:    pantoprazole  (PROTONIX ) 40 MG tablet, TAKE 1 TABLET BY MOUTH ONCE DAILY 30 TO 60 MINUTES BEFORE FIRST MEAL OF THE DAY, Disp: 30 tablet, Rfl: 1   sertraline  (ZOLOFT ) 50 MG tablet, Take 1 tablet (50 mg total) by mouth daily. **NEEDS TO BE SEEN BEFORE NEXT REFILL**, Disp: 30 tablet, Rfl: 0   varenicline  (CHANTIX ) 1 MG tablet, Take 1 tablet by mouth twice daily, Disp: 60 tablet, Rfl: 5 Social History   Socioeconomic History   Marital status: Married    Spouse name: Not on file   Number of children: Not on file   Years of education: Not on file   Highest education level: Not on file  Occupational History   Not on file  Tobacco Use   Smoking status: Every Day    Current packs/day: 1.50    Average packs/day: 1.5 packs/day for 52.0 years (78.0 ttl pk-yrs)    Types: Cigarettes    Start date: 06/02/1972   Smokeless tobacco: Never   Tobacco comments:    Pt currently smokes 7-13 cigarettes/day... Verified by [facility] 02/22/2023  Vaping Use   Vaping status: Never Used  Substance and Sexual Activity   Alcohol use: Yes    Alcohol/week: 1.0 standard drink of alcohol    Types:  1 Cans of beer per week    Comment: rarely   Drug use: No   Sexual activity: Never  Other Topics Concern   Not on file  Social History Narrative   Not on file   Social Drivers of Health   Financial Resource Strain: Not on file  Food Insecurity: Not on file  Transportation Needs: Not on file  Physical Activity: Not on file  Stress: Not on file  Social Connections: Not on file  Intimate Partner Violence: Not on file   Family History  Problem Relation Age of Onset   Cancer Mother    COPD Mother    Hearing loss Father    Cancer Maternal Aunt    Cancer Maternal Aunt    COPD Paternal Uncle    Cancer Maternal Grandmother    COPD Maternal Grandmother    Varicose Veins Maternal Grandfather    Arthritis Paternal Grandmother    COPD Paternal Grandfather     Hearing loss Paternal Grandfather    Early death Son    Mental illness Son    Depression Son    Colon cancer Neg Hx    Esophageal cancer Neg Hx    Rectal cancer Neg Hx    Stomach cancer Neg Hx    Breast cancer Neg Hx     Objective: Office vital signs reviewed. BP 138/72   Pulse 62   Temp (!) 97.4 F (36.3 C)   Ht 5' 2 (1.575 m)   Wt 144 lb 8 oz (65.5 kg)   SpO2 96%   BMI 26.43 kg/m   Physical Examination:  General: Awake, alert, well nourished, No acute distress HEENT: Sclera white.  Moist mucous membranes.  Right TM with slightly dulled light reflex but no bulging, no erythema and no substantial cerumen in the external auditory canal. Cardio: regular rate and rhythm  Pulm:  normal work of breathing on room air Extremities: warm, well perfused, No edema, cyanosis or clubbing; +2 pulses bilaterally Skin: 1.5 cm dark brown stuck on appearing lesion consistent with seborrheic keratosis along the right posterior lateral thigh.  She has slightly lighter and much smaller versions of these on her back, with a waxy yellow lesion at the apex of her scalp  Skin Biopsy Procedure:  Risks and benefits of procedure were reviewed with the patient.  Written consent scanned into chart.  Area of concern located on right posterior thigh.  Area was cleaned with alcohol swabs x2.  Local anesthesia was achieved by injecting 1.5 cc of lidocaine 2% with epinephrine.  The area was then cleaned with ChloraPrep. Skin blade was used to biopsy lesion.  1 cc Estimated blood loss.  Hemostasis achieved with pressure and silver nitrate.  Area was cleaned, and a clean bandage was applied.  Patient tolerated procedure well and there were no immediate complications.  Home care instructions were reviewed with the patient.  Assessment/ Plan: 58 y.o. female   Inflamed seborrheic keratosis - Plan: CMP14+EGFR  Eustachian tube disorder, right - Plan: cetirizine  (ZYRTEC ) 10 MG tablet, fluticasone  (FLONASE ) 50 MCG/ACT  nasal spray, CMP14+EGFR  Chronic obstructive pulmonary disease, unspecified COPD type (HCC) - Plan: albuterol  (VENTOLIN  HFA) 108 (90 Base) MCG/ACT inhaler, budesonide -glycopyrrolate-formoterol  (BREZTRI  AEROSPHERE) 160-9-4.8 MCG/ACT AERO inhaler, CMP14+EGFR, CBC with Differential  Pure hypercholesterolemia - Plan: CMP14+EGFR, Lipid Panel, TSH  Grief reaction - Plan: sertraline  (ZOLOFT ) 50 MG tablet, CMP14+EGFR   Shave biopsy successful with no immediate complications.  Home care instructions reviewed with the patient and handout provided  No evidence of infection in the right ear.  Encouraged use of Flonase .  This has been renewed along with her antihistamine  Continues to smoke.  Has known COPD.  Inhalers have been renewed.  Check CBC with differential.  Would like her to consider lung cancer screening  Nonfasting labs collected.  Not currently treated with statin.  Last ASCVD rescore was 7.1% but is been a few years since we have gotten labs on her  Zoloft  renewed.  She may follow-up in 1 year, sooner if concerns arise for annual physical   Zayneb Baucum M Aneshia Jacquet, DO [facility] [phone]

## 2024-06-10 NOTE — Patient Instructions (Addendum)
 Keep area clean with soap and water Ok to use vasoline on area to promote wound healing. Consider lung cancer screening with CT scan since you are a smoker.  Let me know if you want this ordered and we can get that done for you.  Seborrheic Keratosis A seborrheic keratosis is a common, noncancerous (benign) skin growth. These growths are velvety, waxy, or rough spots that appear on the skin. They are often tan, brown, or black. The skin growths can be flat or raised and may be scaly. What are the causes? The cause of this condition is not known. What increases the risk? You are more likely to develop this condition if you: Have a family history of seborrheic keratosis. Are 60 years old or older. Are pregnant. Have had estrogen replacement therapy. What are the signs or symptoms? Symptoms of this condition include growths on the face, chest, shoulders, back, or other areas. These growths: Are usually painless, but may become irritated and itchy. Can be tan, yellow, brown, black, or other colors. Are slightly raised or have a flat surface. Are sometimes rough or wart-like in texture. Are often velvety or waxy on the surface. Are round or oval-shaped. Often occur in groups, but may occur as a single growth. How is this diagnosed? This condition is diagnosed with a medical history and physical exam. A sample of the growth may be tested (skin biopsy). You may also need to see a skin specialist (dermatologist). How is this treated? Treatment is not usually needed for this condition unless the growths are irritated or bleed often. You may also choose to have the growths removed if you do not like their appearance. Growth removal may include a procedure in which: Liquid nitrogen is applied to freeze off the growth (cryosurgery). This is the most common procedure. The growth is burned off with electricity (electrocautery). The growth is removed by scraping (curettage). Follow these  instructions at home: Watch your growth or growths for any changes. Do not scratch or pick at the growth or growths. This can cause them to become irritated or infected. Contact a health care provider if: You suddenly have many new growths. Your growth bleeds, itches, or hurts. Your growth suddenly becomes larger or changes color. Summary A seborrheic keratosis is a common, noncancerous skin growth. Treatment is not usually needed for this condition unless the growths are irritated or bleed often. Watch your growth or growths for any changes. Contact a health care provider if you suddenly have many new growths or your growth suddenly becomes larger or changes color. This information is not intended to replace advice given to you by your health care provider. Make sure you discuss any questions you have with your health care provider. Document Revised: 11/03/2021 Document Reviewed: 11/03/2021 Elsevier Patient Education  2024 [employer].

## 2024-06-11 ENCOUNTER — Other Ambulatory Visit (HOSPITAL_COMMUNITY)

## 2024-06-11 LAB — CBC WITH DIFFERENTIAL/PLATELET
Basophils Absolute: 0.1 x10E3/uL (ref 0.0–0.2)
Basos: 1 %
EOS (ABSOLUTE): 0.1 x10E3/uL (ref 0.0–0.4)
Eos: 2 %
Hematocrit: 45.2 % (ref 34.0–46.6)
Hemoglobin: 14.5 g/dL (ref 11.1–15.9)
Immature Grans (Abs): 0 x10E3/uL (ref 0.0–0.1)
Immature Granulocytes: 0 %
Lymphocytes Absolute: 2.6 x10E3/uL (ref 0.7–3.1)
Lymphs: 30 %
MCH: 29.9 pg (ref 26.6–33.0)
MCHC: 32.1 g/dL (ref 31.5–35.7)
MCV: 93 fL (ref 79–97)
Monocytes Absolute: 0.6 x10E3/uL (ref 0.1–0.9)
Monocytes: 7 %
Neutrophils Absolute: 5.3 x10E3/uL (ref 1.4–7.0)
Neutrophils: 60 %
Platelets: 321 x10E3/uL (ref 150–450)
RBC: 4.85 x10E6/uL (ref 3.77–5.28)
RDW: 13.3 % (ref 11.7–15.4)
WBC: 8.7 x10E3/uL (ref 3.4–10.8)

## 2024-06-11 LAB — CMP14+EGFR
ALT: 17 IU/L (ref 0–32)
AST: 13 IU/L (ref 0–40)
Albumin: 4.4 g/dL (ref 3.8–4.9)
Alkaline Phosphatase: 115 IU/L (ref 49–135)
BUN/Creatinine Ratio: 15 (ref 12–28)
BUN: 13 mg/dL (ref 8–27)
Bilirubin Total: <0.2 mg/dL (ref 0.0–1.2)
CO2: 21 mmol/L (ref 20–29)
Calcium: 10 mg/dL (ref 8.7–10.3)
Chloride: 102 mmol/L (ref 96–106)
Creatinine, Ser: 0.84 mg/dL (ref 0.57–1.00)
Globulin, Total: 3.1 g/dL (ref 1.5–4.5)
Glucose: 82 mg/dL (ref 70–99)
Potassium: 5.7 mmol/L — AB (ref 3.5–5.2)
Sodium: 140 mmol/L (ref 134–144)
Total Protein: 7.5 g/dL (ref 6.0–8.5)
eGFR: 80 mL/min/1.73 (ref >59)

## 2024-06-11 LAB — LIPID PANEL
Chol/HDL Ratio: 5.5 ratio — AB (ref 0.0–4.4)
Cholesterol, Total: 271 mg/dL — AB (ref 100–199)
HDL: 49 mg/dL (ref >39)
LDL Chol Calc (NIH): 195 mg/dL — AB (ref 0–99)
Triglycerides: 146 mg/dL (ref 0–149)
VLDL Cholesterol Cal: 27 mg/dL (ref 5–40)

## 2024-06-11 LAB — TSH: TSH: 4.08 u[IU]/mL (ref 0.450–4.500)

## 2024-06-12 ENCOUNTER — Encounter: Payer: Self-pay | Admitting: Family Medicine

## 2024-06-12 ENCOUNTER — Ambulatory Visit: Payer: Self-pay | Admitting: Family Medicine

## 2024-06-12 DIAGNOSIS — E7800 Pure hypercholesterolemia, unspecified: Secondary | ICD-10-CM

## 2024-06-12 DIAGNOSIS — E875 Hyperkalemia: Secondary | ICD-10-CM

## 2024-06-12 MED ORDER — ROSUVASTATIN CALCIUM 10 MG PO TABS: 10.0000 mg | ORAL_TABLET | Freq: Every day | ORAL | 3 refills | Status: AC

## 2024-06-15 NOTE — Telephone Encounter (Signed)
 Patient aware and verbalized understanding. Lab appointment scheduled. Patient unable to come in until Wednesday due to work.

## 2024-06-17 ENCOUNTER — Other Ambulatory Visit

## 2024-06-17 DIAGNOSIS — E875 Hyperkalemia: Secondary | ICD-10-CM

## 2024-06-17 LAB — POTASSIUM: Potassium: 5.1 mmol/L (ref 3.5–5.2)

## 2024-06-19 ENCOUNTER — Ambulatory Visit: Payer: Self-pay | Admitting: Family Medicine

## 2024-06-19 LAB — GENECONNECT MOLECULAR SCREEN: Genetic Analysis Overall Interpretation: NEGATIVE

## 2024-06-20 DIAGNOSIS — H2513 Age-related nuclear cataract, bilateral: Secondary | ICD-10-CM | POA: Diagnosis not present

## 2024-06-20 DIAGNOSIS — H40033 Anatomical narrow angle, bilateral: Secondary | ICD-10-CM | POA: Diagnosis not present

## 2024-07-05 ENCOUNTER — Other Ambulatory Visit: Payer: Self-pay | Admitting: Internal Medicine

## 2024-08-31 ENCOUNTER — Telehealth: Admitting: Emergency Medicine

## 2024-08-31 DIAGNOSIS — B9689 Other specified bacterial agents as the cause of diseases classified elsewhere: Secondary | ICD-10-CM

## 2024-08-31 DIAGNOSIS — J208 Acute bronchitis due to other specified organisms: Secondary | ICD-10-CM

## 2024-08-31 MED ORDER — DOXYCYCLINE HYCLATE 100 MG PO TABS: 100.0000 mg | ORAL_TABLET | Freq: Two times a day (BID) | ORAL | 0 refills | Status: AC

## 2024-08-31 NOTE — Progress Notes (Signed)
 We are sorry that you are not feeling well.  Here is how we plan to help!  Based on your presentation I believe you most likely have A cough due to bacteria.  When patients have a fever and a productive cough with a change in color or increased sputum production, we are concerned about bacterial bronchitis.  If left untreated it can progress to pneumonia.  If your symptoms do not improve with your treatment plan it is important that you contact your provider.   I have prescribed Doxycycline  100 mg twice a day for 7 days    I also recommend you take Mucinex  and use saline nasal spray several times a day for congestion.   From your responses in the eVisit questionnaire you describe inflammation in the upper respiratory tract which is causing a significant cough.  This is commonly called Bronchitis and has four common causes:   Allergies Viral Infections Acid Reflux Bacterial Infection Allergies, viruses and acid reflux are treated by controlling symptoms or eliminating the cause. An example might be a cough caused by taking certain blood pressure medications. You stop the cough by changing the medication. Another example might be a cough caused by acid reflux. Controlling the reflux helps control the cough.  USE OF BRONCHODILATOR (RESCUE) INHALERS: There is a risk from using your bronchodilator too frequently.  The risk is that over-reliance on a medication which only relaxes the muscles surrounding the breathing tubes can reduce the effectiveness of medications prescribed to reduce swelling and congestion of the tubes themselves.  Although you feel brief relief from the bronchodilator inhaler, your asthma may actually be worsening with the tubes becoming more swollen and filled with mucus.  This can delay other crucial treatments, such as oral steroid medications. If you need to use a bronchodilator inhaler daily, several times per day, you should discuss this with your provider.  There are probably  better treatments that could be used to keep your asthma under control.     HOME CARE Only take medications as instructed by your medical team. Complete the entire course of an antibiotic. Drink plenty of fluids and get plenty of rest. Avoid close contacts especially the very young and the elderly Cover your mouth if you cough or cough into your sleeve. Always remember to wash your hands A steam or ultrasonic humidifier can help congestion.   GET HELP RIGHT AWAY IF: You develop worsening fever. You become short of breath You cough up blood. Your symptoms persist after you have completed your treatment plan MAKE SURE YOU  Understand these instructions. Will watch your condition. Will get help right away if you are not doing well or get worse.  Your e-visit answers were reviewed by a board certified advanced clinical practitioner to complete your personal care plan.  Depending on the condition, your plan could have included both over the counter or prescription medications. If there is a problem please reply  once you have received a response from your provider. Your safety is important to us .  If you have drug allergies check your prescription carefully.    You can use MyChart to ask questions about todays visit, request a non-urgent call back, or ask for a work or school excuse for 24 hours related to this e-Visit. If it has been greater than 24 hours you will need to follow up with your provider, or enter a new e-Visit to address those concerns. You will get an e-mail in the next two days asking  about your experience.  I hope that your e-visit has been valuable and will speed your recovery. Thank you for using e-visits.  I have spent 5 minutes in review of e-visit questionnaire, review and updating patient chart, medical decision making and response to patient.   Jon Belt, PhD, FNP-BC
